# Patient Record
Sex: Female | Born: 1975 | Race: Black or African American | Hispanic: No | Marital: Married | Smoking: Never smoker
Health system: Southern US, Community
[De-identification: ages and names within clinical notes are randomized; demographics above are authoritative.]

## PROBLEM LIST (undated history)

## (undated) DIAGNOSIS — E119 Type 2 diabetes mellitus without complications: Secondary | ICD-10-CM

---

## 2005-06-03 ENCOUNTER — Encounter: Admission: RE | Admit: 2005-06-03 | Discharge: 2005-06-03 | Payer: Self-pay | Admitting: Family Medicine

## 2006-01-27 ENCOUNTER — Encounter: Admission: RE | Admit: 2006-01-27 | Discharge: 2006-01-27 | Payer: Self-pay | Admitting: Family Medicine

## 2006-03-29 ENCOUNTER — Emergency Department (HOSPITAL_COMMUNITY): Admission: EM | Admit: 2006-03-29 | Discharge: 2006-03-29 | Payer: Self-pay | Admitting: Emergency Medicine

## 2006-11-27 ENCOUNTER — Ambulatory Visit: Payer: Self-pay | Admitting: Endocrinology

## 2007-06-23 ENCOUNTER — Encounter: Payer: Self-pay | Admitting: *Deleted

## 2007-06-23 DIAGNOSIS — R635 Abnormal weight gain: Secondary | ICD-10-CM | POA: Insufficient documentation

## 2007-06-23 DIAGNOSIS — E282 Polycystic ovarian syndrome: Secondary | ICD-10-CM | POA: Insufficient documentation

## 2007-06-23 DIAGNOSIS — M545 Low back pain, unspecified: Secondary | ICD-10-CM | POA: Insufficient documentation

## 2007-06-23 DIAGNOSIS — E119 Type 2 diabetes mellitus without complications: Secondary | ICD-10-CM | POA: Insufficient documentation

## 2007-08-16 ENCOUNTER — Encounter: Payer: Self-pay | Admitting: Endocrinology

## 2007-08-20 ENCOUNTER — Encounter: Payer: Self-pay | Admitting: Endocrinology

## 2007-08-21 ENCOUNTER — Ambulatory Visit: Payer: Self-pay | Admitting: Endocrinology

## 2007-08-21 ENCOUNTER — Telehealth (INDEPENDENT_AMBULATORY_CARE_PROVIDER_SITE_OTHER): Payer: Self-pay | Admitting: *Deleted

## 2007-10-02 ENCOUNTER — Telehealth: Payer: Self-pay | Admitting: Endocrinology

## 2007-10-02 ENCOUNTER — Ambulatory Visit: Payer: Self-pay | Admitting: Endocrinology

## 2007-10-02 DIAGNOSIS — I10 Essential (primary) hypertension: Secondary | ICD-10-CM | POA: Insufficient documentation

## 2007-10-02 DIAGNOSIS — K769 Liver disease, unspecified: Secondary | ICD-10-CM | POA: Insufficient documentation

## 2007-10-03 LAB — CONVERTED CEMR LAB
ALT: 27 units/L (ref 0–35)
AST: 25 units/L (ref 0–37)
Albumin: 3.5 g/dL (ref 3.5–5.2)
Alkaline Phosphatase: 61 units/L (ref 39–117)
BUN: 8 mg/dL (ref 6–23)
Bilirubin Urine: NEGATIVE
Bilirubin, Direct: 0.1 mg/dL (ref 0.0–0.3)
CO2: 26 meq/L (ref 19–32)
Calcium: 8.5 mg/dL (ref 8.4–10.5)
Chloride: 106 meq/L (ref 96–112)
Creatinine, Ser: 0.6 mg/dL (ref 0.4–1.2)
GFR calc Af Amer: 149 mL/min
GFR calc non Af Amer: 123 mL/min
Glucose, Bld: 97 mg/dL (ref 70–99)
Hemoglobin, Urine: NEGATIVE
Hgb A1c MFr Bld: 5.3 % (ref 4.6–6.0)
Ketones, ur: NEGATIVE mg/dL
Leukocytes, UA: NEGATIVE
Nitrite: NEGATIVE
Potassium: 3.9 meq/L (ref 3.5–5.1)
Sodium: 139 meq/L (ref 135–145)
Specific Gravity, Urine: 1.015 (ref 1.000–1.03)
TSH: 1.74 microintl units/mL (ref 0.35–5.50)
Total Bilirubin: 0.5 mg/dL (ref 0.3–1.2)
Total Protein, Urine: NEGATIVE mg/dL
Total Protein: 6.8 g/dL (ref 6.0–8.3)
Urine Glucose: NEGATIVE mg/dL
Urobilinogen, UA: 0.2 (ref 0.0–1.0)
pH: 6 (ref 5.0–8.0)

## 2008-01-10 ENCOUNTER — Emergency Department (HOSPITAL_COMMUNITY): Admission: EM | Admit: 2008-01-10 | Discharge: 2008-01-11 | Payer: Self-pay | Admitting: Emergency Medicine

## 2009-03-03 ENCOUNTER — Inpatient Hospital Stay (HOSPITAL_COMMUNITY): Admission: AD | Admit: 2009-03-03 | Discharge: 2009-03-03 | Payer: Self-pay | Admitting: Obstetrics & Gynecology

## 2010-02-19 ENCOUNTER — Ambulatory Visit (HOSPITAL_COMMUNITY): Admission: RE | Admit: 2010-02-19 | Discharge: 2010-02-19 | Payer: Self-pay | Admitting: Surgery

## 2010-03-04 ENCOUNTER — Ambulatory Visit (HOSPITAL_COMMUNITY): Admission: RE | Admit: 2010-03-04 | Discharge: 2010-03-04 | Payer: Self-pay | Admitting: Surgery

## 2010-03-18 ENCOUNTER — Encounter: Admission: RE | Admit: 2010-03-18 | Discharge: 2010-03-18 | Payer: Self-pay | Admitting: Surgery

## 2010-03-25 ENCOUNTER — Ambulatory Visit (HOSPITAL_BASED_OUTPATIENT_CLINIC_OR_DEPARTMENT_OTHER): Admission: RE | Admit: 2010-03-25 | Discharge: 2010-03-25 | Payer: Self-pay | Admitting: Surgery

## 2010-03-27 ENCOUNTER — Ambulatory Visit: Payer: Self-pay | Admitting: Internal Medicine

## 2010-10-02 ENCOUNTER — Ambulatory Visit (HOSPITAL_COMMUNITY): Admission: RE | Admit: 2010-10-02 | Payer: Self-pay | Admitting: Surgery

## 2010-10-05 ENCOUNTER — Ambulatory Visit: Payer: Self-pay | Admitting: Hematology and Oncology

## 2010-11-04 NOTE — Assessment & Plan Note (Signed)
Summary: DIABETIC-STC   Vital Signs:  Patient Profile:   35 Years Old Female Weight:      293.38 pounds Temp:     98.3 degrees F Pulse rate:   93 / minute BP sitting:   135 / 97  (left arm) Cuff size:   large  Vitals Entered By: Orlan Leavens (August 21, 2007 8:15 AM)                 Visit Type:  f/u  Chief Complaint:  dm.  History of Present Illness: she is not currently taking her metformin.  She is also currently pursuing bariatric surgery.  She had a 75g oral glucose tolerance test which showed a fasting glucose of 134.  It then increased to 287 at one hour, and 256 at two hours.  Since she was last here about 9 months ago, she gained 4 pounds.  This is despite her efforts. She also continues to have manner Fredrik Cove, for which she is scheduled to have D and c surgery in two days. Symptomatically, she has some heartburn, headache, and a few myalgias.    Current Allergies: No known allergies   Past Medical History:    Reviewed history from 06/23/2007 and no changes required:       Diabetes mellitus, type II       Low back pain     Review of Systems  The patient denies chest pain and dyspnea on exhertion.     Physical Exam  General:     obese.   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 Take 1 by mouth two times a day qd  Her updated medication list for this problem includes:    Glucophage Xr 500 Mg Tb24 (Metformin hcl) .Marland KitchenMarland KitchenMarland KitchenMarland Kitchen 4 tablets daily   Problem # 2:  DIABETES MELLITUS, TYPE II  (ICD-250.00)  The following medications were removed from the medication list:    Glucophage Xr 500 Mg Tb24 (Metformin hcl) .Marland Kitchen... Take 1 by mouth two times a day qd  Her updated medication list for this problem includes:    Glucophage Xr 500 Mg Tb24 (Metformin hcl) .Marland KitchenMarland KitchenMarland KitchenMarland Kitchen 4 tablets daily  Orders: Est. Patient Level IV (16109) EKG w/ Interpretation (93000)   Problem # 3:  gerd  Medications Added to Medication List This Visit: 1)  Glucophage Xr 500 Mg Tb24 (Metformin hcl) .... 4 tablets daily   Patient Instructions: 1)  metformin extended release 2000 mg a day 2)  return 6 weeks 3)  in my opinion, she is medically cleared for surgery.  Her risk is low and acceptable 4)  medication for her GERD is declined    Prescriptions: GLUCOPHAGE XR 500 MG  TB24 (METFORMIN HCL) 4 tablets daily  #120 x 11   Entered and Authorized by:   Minus Breeding MD   Signed by:   Minus Breeding MD on 08/21/2007   Method used:   Electronically sent to .Marland KitchenMarland Kitchen  [facility] [address] # [phone]*       [address]       [place], [place]  [address]       Ph: [phone]       Fax: [phone]   RxID:   [id number]  ]  Appended Document: DIABETIC-STC FAXED NOTES TO DR. FOGELMAN @[phone]/LMB

## 2010-11-04 NOTE — Progress Notes (Signed)
  Phone Note Call from Patient Call back at Home Phone [phone] Call back at [phone]   Caller: Patient Call For: dr Everardo All Summary of Call: per pt saw dr Everardo All on yesterday was dx  with diabetes on yesterday and  was prescribe metforim  today . drop  down to  low sugars 66 twice today . where is she suppose to be reguarding level wise . should her medication  be adjusted.Patient's chart has been requested.  Initial call taken by: Shelbie Proctor,  August 21, 2007 3:32 PM  Follow-up for Phone Call        please continue to take metformin--66 is a good blood sugar Follow-up by: Minus Breeding MD,  August 21, 2007 8:10 PM  Additional Follow-up for Phone Call Additional follow up Details #1::        Phone Call Completed pt made aware spoke with pt made aware Additional Follow-up by: Shelbie Proctor,  August 22, 2007 8:21 AM

## 2010-11-04 NOTE — Progress Notes (Signed)
Summary: refill  Phone Note Call from Patient Call back at Home Phone [phone]   Caller: Patient Call For: dr Everardo All  Summary of Call: need an rx for soma [facility] [phone] Initial call taken by: Shelbie Proctor,  October 02, 2007 4:50 PM  Follow-up for Phone Call        sent Follow-up by: Minus Breeding MD,  October 02, 2007 5:01 PM    New/Updated Medications: SOMA 250 MG  TABS (CARISOPRODOL) take 1 by mouth once daily as needed pain or cramps   Prescriptions: SOMA 250 MG  TABS (CARISOPRODOL) take 1 by mouth once daily as needed pain or cramps  #50 x 6   Entered and Authorized by:   Minus Breeding MD   Signed by:   Minus Breeding MD on 10/02/2007   Method used:   Electronically sent to ...       [facility] # [phone]*       [address]       [place], [place]  [address]       Ph: [phone]       Fax: [phone]   RxID:   [id number]

## 2010-11-04 NOTE — Letter (Signed)
Summary: [facility]  [facility]   Imported By: Esmeralda Links D'jimraou 08/27/2007 14:46:43  _____________________________________________________________________  External Attachment:    Type:   Image     Comment:   External Document

## 2010-11-04 NOTE — Assessment & Plan Note (Signed)
Summary: 6 WK FU-STC   Vital Signs:  Patient Profile:   35 Years Old Female Weight:      291.2 pounds Temp:     97.9 degrees F oral Pulse rate:   88 / minute BP sitting:   143 / 93  (right arm) Cuff size:   large  Vitals Entered By: Orlan Leavens (October 02, 2007 8:00 AM)                 Visit Type:  Follow-up Visit  Chief Complaint:  dm.  History of Present Illness: patient states she is taking her metformin, despite some GI upset.  She gets from the hypertension is again noted today was noted on life insurance form to have elev lft 1 month of right arm pain.  no assoc numbness.  affects the forearm and the wrist+hand.  no local injury.   Current Allergies: No known allergies   Past Medical History:    Reviewed history from 06/23/2007 and no changes required:       UNSPECIFIED DISORDER OF LIVER (ICD-573.9)       HYPERTENSION (ICD-401.9)       POLYCYSTIC OVARIAN DISEASE (ICD-256.4)       WEIGHT GAIN (ICD-783.1)       LOW BACK PAIN (ICD-724.2)       DIABETES MELLITUS, TYPE II (ICD-250.00)            Review of Systems  The patient denies chest pain and dyspnea on exhertion.     Physical Exam  General:     obese.   Extremities:     no edema    Impression & Recommendations:  Problem # 1:  WEIGHT GAIN (ICD-783.1)  Problem # 2:  DIABETES MELLITUS, TYPE II (ICD-250.00)  Her updated medication list for this problem includes:    Glucophage Xr 500 Mg Tb24 (Metformin hcl) .Marland KitchenMarland KitchenMarland KitchenMarland Kitchen 4 tablets daily  Orders: TLB-TSH (Thyroid Stimulating Hormone) (84443-TSH) TLB-BMP (Basic Metabolic Panel-BMET) (80048-METABOL) TLB-A1C / Hgb A1C (Glycohemoglobin) (83036-A1C) TLB-Udip ONLY (81003-UDIP) Est. Patient Level IV (95621)   Problem # 3:  HYPERTENSION (ICD-401.9)  Problem # 4:  UNSPECIFIED DISORDER OF LIVER (ICD-573.9)  Orders: TLB-Hepatic/Liver Function Pnl (80076-HEPATIC)    Patient Instructions: 1)  check a1c, ua, tsh, lft,  and bmet 2)  bp rx is  refused--pt advised of risk 3)  ret 6 mos 4)  redouble diet efforts    ]

## 2011-01-10 LAB — CBC
HCT: 32.3 % — ABNORMAL LOW (ref 36.0–46.0)
Hemoglobin: 11 g/dL — ABNORMAL LOW (ref 12.0–15.0)
MCHC: 34 g/dL (ref 30.0–36.0)
MCV: 89.6 fL (ref 78.0–100.0)
Platelets: 289 10*3/uL (ref 150–400)
RBC: 3.6 MIL/uL — ABNORMAL LOW (ref 3.87–5.11)
RDW: 15.9 % — ABNORMAL HIGH (ref 11.5–15.5)
WBC: 10.9 10*3/uL — ABNORMAL HIGH (ref 4.0–10.5)

## 2011-01-10 LAB — GLUCOSE, CAPILLARY: Glucose-Capillary: 90 mg/dL (ref 70–99)

## 2011-02-18 NOTE — Consult Note (Signed)
[facility]                          ENDOCRINOLOGY CONSULTATION   Jade Sims, Jade Sims                    MRN:          [id number]  DATE:11/27/2006                            DOB:          [birth year]    REASON FOR REFERRAL:  At risk for diabetes.   HISTORY OF PRESENT ILLNESS:  A 35 year old woman who had gestational  diabetes with her 2002 pregnancy.  She describes her diet as good but  her exercise is poor.  Specifically, she states her exercise has been  limited by back pain.   Regarding her polycystic ovary syndrome, she is back on Yasmin because  she had to go off her metformin due to diarrhea.  She has gained 10  pounds in the past 6 months despite her efforts.   PAST MEDICAL HISTORY:  Chronic low back pain.   MEDICATIONS:  Only as noted above.   SOCIAL HISTORY:  She is married.  She works as a Customer service manager.   FAMILY HISTORY:  Positive for her mother who she states is thin.   REVIEW OF SYSTEMS:  Denies the following:  Chest pain, shortness of  breath and rectal bleeding.   PHYSICAL EXAMINATION:  Blood pressure 138/83, heart rate 94, temperature  is 98.4.  The weight is 289.  GENERAL:  Obese, no distress.  SKIN:  Normal texture and temperature.  Not diaphoretic.  HEENT:  No proptosis.  No periorbital swelling.  NECK:  No goiter.  CHEST:  Clear to auscultation.  No respiratory distress.  CARDIOVASCULAR:  No JVD, no edema.  Regular rate and rhythm.  No murmur.  Pedal pulses are intact.  EXTREMITIES:  Feet are normal color and temperature.  There is no ulcer  present on the feet.  NEUROLOGIC:  Sensation is intact to touch in the feet.   IMPRESSION:  1. History of gestational diabetes.  2. Diarrhea due to her metformin.  3. Chronic weight gain.  4. Polycystic ovary syndrome.  5. History of gestational diabetes.   PLAN:  1. I gave her a prescription to try extended release metformin      instead, 500 mg a day.  2. Return 3  months.  3. I agree with the continuation of the Yasmin.  4. Referred to a bariatric surgeon.  Referral is being made today.     Sean A. Everardo All, MD  Electronically Signed    SAE/MedQ  DD: 11/28/2006  DT: 11/28/2006  Job #: [id number]   cc:   Maxie Better, M.D.

## 2011-06-28 LAB — COMPREHENSIVE METABOLIC PANEL
ALT: 23
AST: 21
Albumin: 3.8
Alkaline Phosphatase: 57
BUN: 10
CO2: 22
Calcium: 8.6
Chloride: 105
Creatinine, Ser: 0.65
GFR calc Af Amer: >60
GFR calc non Af Amer: >60
Glucose, Bld: 108 — ABNORMAL HIGH
Potassium: 3.9
Sodium: 135
Total Bilirubin: 0.6
Total Protein: 7.2

## 2011-06-28 LAB — DIFFERENTIAL
Basophils Absolute: 0
Basophils Relative: 0
Eosinophils Absolute: 0.1
Eosinophils Relative: 2
Lymphocytes Relative: 25
Lymphs Abs: 0.7
Monocytes Absolute: 0.3
Monocytes Relative: 12
Neutro Abs: 1.6 — ABNORMAL LOW
Neutrophils Relative %: 60

## 2011-06-28 LAB — CBC
HCT: 35.8 — ABNORMAL LOW
Hemoglobin: 12.5
MCHC: 34.7
MCV: 83.3
Platelets: 258
RBC: 4.3
RDW: 18.1 — ABNORMAL HIGH
WBC: 2.7 — ABNORMAL LOW

## 2011-06-28 LAB — URINALYSIS, ROUTINE W REFLEX MICROSCOPIC
Bilirubin Urine: NEGATIVE
Glucose, UA: NEGATIVE
Hgb urine dipstick: NEGATIVE
Nitrite: NEGATIVE
Protein, ur: NEGATIVE
Specific Gravity, Urine: 1.028
Urobilinogen, UA: 0.2
pH: 6

## 2011-06-28 LAB — MONONUCLEOSIS SCREEN: Mono Screen: NEGATIVE

## 2011-07-15 ENCOUNTER — Emergency Department (HOSPITAL_COMMUNITY)
Admission: EM | Admit: 2011-07-15 | Discharge: 2011-07-16 | Disposition: A | Discharge status: Home / Self Care | Payer: BC Managed Care – PPO | Attending: Emergency Medicine | Admitting: Emergency Medicine

## 2011-07-15 DIAGNOSIS — Z79899 Other long term (current) drug therapy: Secondary | ICD-10-CM | POA: Insufficient documentation

## 2011-07-15 DIAGNOSIS — R1013 Epigastric pain: Secondary | ICD-10-CM | POA: Insufficient documentation

## 2011-07-15 DIAGNOSIS — E559 Vitamin D deficiency, unspecified: Secondary | ICD-10-CM | POA: Insufficient documentation

## 2011-07-15 DIAGNOSIS — I880 Nonspecific mesenteric lymphadenitis: Secondary | ICD-10-CM | POA: Insufficient documentation

## 2011-07-15 DIAGNOSIS — E119 Type 2 diabetes mellitus without complications: Secondary | ICD-10-CM | POA: Insufficient documentation

## 2011-07-15 DIAGNOSIS — E282 Polycystic ovarian syndrome: Secondary | ICD-10-CM | POA: Insufficient documentation

## 2011-07-16 ENCOUNTER — Emergency Department (HOSPITAL_COMMUNITY): Payer: BC Managed Care – PPO

## 2011-07-16 LAB — URINE MICROSCOPIC-ADD ON

## 2011-07-16 LAB — DIFFERENTIAL
Basophils Absolute: 0 10*3/uL (ref 0.0–0.1)
Basophils Relative: 0 % (ref 0–1)
Eosinophils Absolute: 0.2 10*3/uL (ref 0.0–0.7)
Eosinophils Relative: 2 % (ref 0–5)
Lymphocytes Relative: 31 % (ref 12–46)
Lymphs Abs: 2.7 10*3/uL (ref 0.7–4.0)
Monocytes Absolute: 0.6 10*3/uL (ref 0.1–1.0)
Monocytes Relative: 6 % (ref 3–12)
Neutro Abs: 5.2 10*3/uL (ref 1.7–7.7)
Neutrophils Relative %: 60 % (ref 43–77)

## 2011-07-16 LAB — CBC
HCT: 39.4 % (ref 36.0–46.0)
Hemoglobin: 12.7 g/dL (ref 12.0–15.0)
MCH: 29.9 pg (ref 26.0–34.0)
MCHC: 32.2 g/dL (ref 30.0–36.0)
MCV: 92.7 fL (ref 78.0–100.0)
Platelets: 314 10*3/uL (ref 150–400)
RBC: 4.25 MIL/uL (ref 3.87–5.11)
RDW: 14.8 % (ref 11.5–15.5)
WBC: 8.7 10*3/uL (ref 4.0–10.5)

## 2011-07-16 LAB — PREGNANCY, URINE: Preg Test, Ur: NEGATIVE

## 2011-07-16 LAB — URINALYSIS, ROUTINE W REFLEX MICROSCOPIC
Glucose, UA: NEGATIVE mg/dL
Ketones, ur: 15 mg/dL — AB
Nitrite: NEGATIVE
Protein, ur: NEGATIVE mg/dL
Specific Gravity, Urine: 1.03 (ref 1.005–1.030)
Urobilinogen, UA: 1 mg/dL (ref 0.0–1.0)
pH: 5.5 (ref 5.0–8.0)

## 2011-07-16 LAB — COMPREHENSIVE METABOLIC PANEL
ALT: 119 U/L — ABNORMAL HIGH (ref 0–35)
AST: 54 U/L — ABNORMAL HIGH (ref 0–37)
Albumin: 3.8 g/dL (ref 3.5–5.2)
Alkaline Phosphatase: 62 U/L (ref 39–117)
BUN: 9 mg/dL (ref 6–23)
CO2: 23 mEq/L (ref 19–32)
Calcium: 9.2 mg/dL (ref 8.4–10.5)
Chloride: 100 mEq/L (ref 96–112)
Creatinine, Ser: 0.67 mg/dL (ref 0.50–1.10)
GFR calc Af Amer: >90 mL/min (ref >90)
GFR calc non Af Amer: >90 mL/min (ref >90)
Glucose, Bld: 93 mg/dL (ref 70–99)
Potassium: 3.4 mEq/L — ABNORMAL LOW (ref 3.5–5.1)
Sodium: 135 mEq/L (ref 135–145)
Total Bilirubin: 0.3 mg/dL (ref 0.3–1.2)
Total Protein: 7.8 g/dL (ref 6.0–8.3)

## 2011-07-16 LAB — LIPASE, BLOOD: Lipase: 27 U/L (ref 11–59)

## 2011-07-17 LAB — URINE CULTURE
Colony Count: 15000
Culture  Setup Time: 201210131122

## 2011-07-26 ENCOUNTER — Other Ambulatory Visit: Payer: Self-pay | Admitting: Gastroenterology

## 2011-07-26 DIAGNOSIS — R7989 Other specified abnormal findings of blood chemistry: Secondary | ICD-10-CM

## 2011-07-28 ENCOUNTER — Other Ambulatory Visit: Payer: BC Managed Care – PPO

## 2011-08-02 ENCOUNTER — Ambulatory Visit
Admission: RE | Admit: 2011-08-02 | Discharge: 2011-08-02 | Disposition: A | Discharge status: Home / Self Care | Payer: BC Managed Care – PPO | Source: Ambulatory Visit | Attending: Gastroenterology | Admitting: Gastroenterology

## 2011-08-02 ENCOUNTER — Other Ambulatory Visit: Payer: BC Managed Care – PPO

## 2011-08-02 DIAGNOSIS — R7989 Other specified abnormal findings of blood chemistry: Secondary | ICD-10-CM

## 2011-08-03 ENCOUNTER — Other Ambulatory Visit: Payer: Self-pay | Admitting: Gastroenterology

## 2011-08-03 DIAGNOSIS — R110 Nausea: Secondary | ICD-10-CM

## 2011-08-04 ENCOUNTER — Ambulatory Visit
Admission: RE | Admit: 2011-08-04 | Discharge: 2011-08-04 | Disposition: A | Discharge status: Home / Self Care | Payer: BC Managed Care – PPO | Source: Ambulatory Visit | Attending: Gastroenterology | Admitting: Gastroenterology

## 2011-08-04 DIAGNOSIS — R110 Nausea: Secondary | ICD-10-CM

## 2011-08-04 MED ORDER — IOHEXOL 300 MG/ML  SOLN: 125.0000 mL | Freq: Once | INTRAMUSCULAR | Status: AC | PRN

## 2011-08-23 ENCOUNTER — Other Ambulatory Visit: Payer: Self-pay | Admitting: Gastroenterology

## 2011-08-23 DIAGNOSIS — R110 Nausea: Secondary | ICD-10-CM

## 2011-08-23 DIAGNOSIS — R1011 Right upper quadrant pain: Secondary | ICD-10-CM

## 2011-09-09 ENCOUNTER — Ambulatory Visit: Payer: BC Managed Care – PPO

## 2011-09-09 NOTE — Progress Notes (Signed)
Pt failed to keep appt. For genetic counseling

## 2011-09-19 ENCOUNTER — Other Ambulatory Visit (HOSPITAL_COMMUNITY): Payer: BC Managed Care – PPO

## 2011-09-21 ENCOUNTER — Other Ambulatory Visit (HOSPITAL_COMMUNITY): Payer: BC Managed Care – PPO

## 2011-10-18 ENCOUNTER — Inpatient Hospital Stay (HOSPITAL_COMMUNITY): Admission: RE | Admit: 2011-10-18 | Payer: BC Managed Care – PPO | Source: Ambulatory Visit

## 2011-10-31 ENCOUNTER — Encounter (HOSPITAL_COMMUNITY)
Admission: RE | Admit: 2011-10-31 | Discharge: 2011-10-31 | Disposition: A | Discharge status: Home / Self Care | Payer: BC Managed Care – PPO | Source: Ambulatory Visit | Attending: Gastroenterology | Admitting: Gastroenterology

## 2011-10-31 DIAGNOSIS — R110 Nausea: Secondary | ICD-10-CM | POA: Insufficient documentation

## 2011-10-31 DIAGNOSIS — R1011 Right upper quadrant pain: Secondary | ICD-10-CM | POA: Insufficient documentation

## 2011-10-31 MED ORDER — TECHNETIUM TC 99M SULFUR COLLOID
2.0000 | Freq: Once | INTRAVENOUS | Status: AC | PRN
  Administered 2011-10-31: 2 via INTRAVENOUS

## 2011-11-22 ENCOUNTER — Encounter (HOSPITAL_COMMUNITY)
Admission: RE | Admit: 2011-11-22 | Discharge: 2011-11-22 | Disposition: A | Discharge status: Home / Self Care | Payer: BC Managed Care – PPO | Source: Ambulatory Visit | Attending: Gastroenterology | Admitting: Gastroenterology

## 2011-11-22 DIAGNOSIS — R1011 Right upper quadrant pain: Secondary | ICD-10-CM

## 2011-11-22 DIAGNOSIS — R110 Nausea: Secondary | ICD-10-CM

## 2014-02-20 ENCOUNTER — Emergency Department (HOSPITAL_COMMUNITY): Payer: BC Managed Care – PPO

## 2014-02-20 ENCOUNTER — Encounter (HOSPITAL_COMMUNITY): Payer: Self-pay | Admitting: Emergency Medicine

## 2014-02-20 ENCOUNTER — Emergency Department (HOSPITAL_COMMUNITY)
Admission: EM | Admit: 2014-02-20 | Discharge: 2014-02-20 | Disposition: A | Discharge status: Home / Self Care | Payer: BC Managed Care – PPO | Attending: Emergency Medicine | Admitting: Emergency Medicine

## 2014-02-20 DIAGNOSIS — E119 Type 2 diabetes mellitus without complications: Secondary | ICD-10-CM | POA: Insufficient documentation

## 2014-02-20 DIAGNOSIS — Z3202 Encounter for pregnancy test, result negative: Secondary | ICD-10-CM | POA: Insufficient documentation

## 2014-02-20 DIAGNOSIS — R202 Paresthesia of skin: Secondary | ICD-10-CM

## 2014-02-20 DIAGNOSIS — R209 Unspecified disturbances of skin sensation: Secondary | ICD-10-CM | POA: Insufficient documentation

## 2014-02-20 HISTORY — DX: Type 2 diabetes mellitus without complications: E11.9

## 2014-02-20 LAB — PROTIME-INR
INR: 0.94 (ref 0.00–1.49)
Prothrombin Time: 12.4 seconds (ref 11.6–15.2)

## 2014-02-20 LAB — CBC
HCT: 37.9 % (ref 36.0–46.0)
Hemoglobin: 12.5 g/dL (ref 12.0–15.0)
MCH: 30.2 pg (ref 26.0–34.0)
MCHC: 33 g/dL (ref 30.0–36.0)
MCV: 91.5 fL (ref 78.0–100.0)
Platelets: 348 10*3/uL (ref 150–400)
RBC: 4.14 MIL/uL (ref 3.87–5.11)
RDW: 14.9 % (ref 11.5–15.5)
WBC: 10.2 10*3/uL (ref 4.0–10.5)

## 2014-02-20 LAB — COMPREHENSIVE METABOLIC PANEL
ALT: 15 U/L (ref 0–35)
AST: 16 U/L (ref 0–37)
Albumin: 3.7 g/dL (ref 3.5–5.2)
Alkaline Phosphatase: 72 U/L (ref 39–117)
BUN: 15 mg/dL (ref 6–23)
CO2: 23 mEq/L (ref 19–32)
Calcium: 9.2 mg/dL (ref 8.4–10.5)
Chloride: 105 mEq/L (ref 96–112)
Creatinine, Ser: 0.69 mg/dL (ref 0.50–1.10)
GFR calc Af Amer: >90 mL/min (ref >90)
GFR calc non Af Amer: >90 mL/min (ref >90)
Glucose, Bld: 89 mg/dL (ref 70–99)
Potassium: 4.3 mEq/L (ref 3.7–5.3)
Sodium: 140 mEq/L (ref 137–147)
Total Bilirubin: <0.2 mg/dL — ABNORMAL LOW (ref 0.3–1.2)
Total Protein: 7.4 g/dL (ref 6.0–8.3)

## 2014-02-20 LAB — DIFFERENTIAL
Basophils Absolute: 0 10*3/uL (ref 0.0–0.1)
Basophils Relative: 0 % (ref 0–1)
Eosinophils Absolute: 0.2 10*3/uL (ref 0.0–0.7)
Eosinophils Relative: 2 % (ref 0–5)
Lymphocytes Relative: 31 % (ref 12–46)
Lymphs Abs: 3.1 10*3/uL (ref 0.7–4.0)
Monocytes Absolute: 0.5 10*3/uL (ref 0.1–1.0)
Monocytes Relative: 5 % (ref 3–12)
Neutro Abs: 6.4 10*3/uL (ref 1.7–7.7)
Neutrophils Relative %: 62 % (ref 43–77)

## 2014-02-20 LAB — CBG MONITORING, ED: Glucose-Capillary: 86 mg/dL (ref 70–99)

## 2014-02-20 LAB — URINALYSIS, ROUTINE W REFLEX MICROSCOPIC
Bilirubin Urine: NEGATIVE
Glucose, UA: NEGATIVE mg/dL
Ketones, ur: NEGATIVE mg/dL
Nitrite: NEGATIVE
Protein, ur: NEGATIVE mg/dL
Specific Gravity, Urine: 1.025 (ref 1.005–1.030)
Urobilinogen, UA: 0.2 mg/dL (ref 0.0–1.0)
pH: 6 (ref 5.0–8.0)

## 2014-02-20 LAB — APTT: aPTT: 32 seconds (ref 24–37)

## 2014-02-20 LAB — URINE MICROSCOPIC-ADD ON

## 2014-02-20 LAB — I-STAT TROPONIN, ED: Troponin i, poc: 0.07 ng/mL (ref 0.00–0.08)

## 2014-02-20 LAB — POC URINE PREG, ED: Preg Test, Ur: NEGATIVE

## 2014-02-20 MED ORDER — VALACYCLOVIR HCL 1 G PO TABS: 1000.0000 mg | ORAL_TABLET | Freq: Three times a day (TID) | ORAL | Status: AC

## 2014-02-20 MED ORDER — VALACYCLOVIR HCL 500 MG PO TABS
1000.0000 mg | ORAL_TABLET | Freq: Once | ORAL | Status: AC
  Administered 2014-02-20: 1000 mg via ORAL
  Filled 2014-02-20: qty 2

## 2014-02-20 NOTE — ED Notes (Signed)
Pt reports yesterday felt numbness to inside of left mouth, also tingling to left side of head. Pt sent here from PCP, sent here, hasn't been taking DM medications for 1 week. Pt is neurologically intact. Speech is clear and concise. No facial droop noted, reports still reports left side of mouth is heavy in the corner.

## 2014-02-20 NOTE — Discharge Instructions (Signed)
Please read and follow all provided instructions.  Your diagnoses today include:  1. Paresthesias     Tests performed today include:  Blood counts and electrolytes - normal  MRI - does not show a stroke or any other serious cause of your facial sensations  Vital signs. See below for your results today.   Medications prescribed:  Valtrex - medication to help with possible Bell's palsy  Take any prescribed medications only as directed.  Home care instructions:  Follow any educational materials contained in this packet.  BE VERY CAREFUL not to take multiple medicines containing Tylenol (also called acetaminophen). Doing so can lead to an overdose which can damage your liver and cause liver failure and possibly death.   Follow-up instructions: Please follow-up with your primary care provider in the next 3 days for further evaluation of your symptoms. If you do not have a primary care doctor -- see below for referral information.   Return instructions:   Please return to the Emergency Department if you experience worsening symptoms.   Please return if you have any other emergent concerns.  Additional Information:  Your vital signs today were: BP 155/78   Pulse 84   Temp(Src) 98.8 F (37.1 C) (Oral)   Resp 18   SpO2 96%   LMP 02/19/2014 If your blood pressure (BP) was elevated above 135/85 this visit, please have this repeated by your doctor within one month. --------------

## 2014-02-20 NOTE — ED Provider Notes (Signed)
CSN: [id number]     Arrival date & time 02/20/14  1724 History   First MD Initiated Contact with Patient 02/20/14 1916     Chief Complaint  Patient presents with  . Numbness     (Consider location/radiation/quality/duration/timing/severity/associated sxs/prior Treatment) HPI Comments: Patient presents with complaint of numbness/tinging to her left face and tingling to her left scalp that began yesterday. Patient saw primary care physician today who sent her to the emergency department for evaluation. Patient states that the sensation feels like "Novocain wearing off". She still has sensation however sensation feels decreased. No change in taste. No facial droop. No head injury. No fever, N/V, neck pain. Patient has diabetes. No hypertension or hypercholesterolemia. She does not smoke. She has a family history of stroke. Patient denies signs of stroke including: facial droop, slurred speech, aphasia, weakness/numbness in extremities, imbalance/trouble walking. The onset of this condition was acute. The course is constant. Aggravating factors: none. Alleviating factors: none.     The history is provided by the patient.    Past Medical History  Diagnosis Date  . Diabetes mellitus without complication    History reviewed. No pertinent past surgical history. No family history on file. History  Substance Use Topics  . Smoking status: Never Smoker   . Smokeless tobacco: Not on file  . Alcohol Use: No   OB History   Grav Para Term Preterm Abortions TAB SAB Ect Mult Living                 Review of Systems  Constitutional: Negative for fever.  HENT: Negative for congestion, dental problem, facial swelling, hearing loss, rhinorrhea, sinus pressure and trouble swallowing.   Eyes: Negative for photophobia, discharge, redness and visual disturbance.  Respiratory: Negative for shortness of breath.   Cardiovascular: Negative for chest pain.  Gastrointestinal: Negative for nausea and  vomiting.  Musculoskeletal: Negative for gait problem, neck pain and neck stiffness.  Skin: Negative for rash.  Neurological: Positive for numbness. Negative for syncope, speech difficulty, weakness, light-headedness and headaches.  Psychiatric/Behavioral: Negative for confusion.    Allergies  Review of patient's allergies indicates no known allergies.  Home Medications   Prior to Admission medications   Not on File   BP 155/92  Pulse 93  Temp(Src) 98.8 F (37.1 C) (Oral)  Resp 20  SpO2 97%  LMP 02/19/2014  Physical Exam  Nursing note and vitals reviewed. Constitutional: She is oriented to person, place, and time. She appears well-developed and well-nourished.  HENT:  Head: Normocephalic and atraumatic.  Right Ear: Tympanic membrane, external ear and ear canal normal.  Left Ear: Tympanic membrane, external ear and ear canal normal.  Nose: Nose normal.  Mouth/Throat: Uvula is midline, oropharynx is clear and moist and mucous membranes are normal.  Eyes: Conjunctivae, EOM and lids are normal. Pupils are equal, round, and reactive to light. Right eye exhibits no nystagmus. Left eye exhibits no nystagmus.  Neck: Normal range of motion. Neck supple.  Cardiovascular: Normal rate and regular rhythm.   Pulmonary/Chest: Effort normal and breath sounds normal.  Abdominal: Soft. There is no tenderness.  Musculoskeletal:       Cervical back: She exhibits normal range of motion, no tenderness and no bony tenderness.  Neurological: She is alert and oriented to person, place, and time. She has normal strength and normal reflexes. A sensory deficit is present. No cranial nerve deficit. She displays a negative Romberg sign. Coordination and gait normal. GCS eye subscore is 4. GCS verbal  subscore is 5. GCS motor subscore is 6.  Pt reports decreased sensation L chin, lips, cheek, forehead, ear.   Skin: Skin is warm and dry.  Psychiatric: She has a normal mood and affect.    ED Course   Procedures (including critical care time) Labs Review Labs Reviewed  COMPREHENSIVE METABOLIC PANEL - Abnormal; Notable for the following:    Total Bilirubin <0.2 (*)    All other components within normal limits  URINALYSIS, ROUTINE W REFLEX MICROSCOPIC - Abnormal; Notable for the following:    APPearance HAZY (*)    Hgb urine dipstick LARGE (*)    Leukocytes, UA TRACE (*)    All other components within normal limits  URINE MICROSCOPIC-ADD ON - Abnormal; Notable for the following:    Squamous Epithelial / LPF FEW (*)    All other components within normal limits  PROTIME-INR  APTT  CBC  DIFFERENTIAL  CBG MONITORING, ED  Rosezena SensorI-STAT TROPOININ, ED  POC URINE PREG, ED    Imaging Review Mr Brain Wo Contrast  02/20/2014   CLINICAL DATA:  Yesterday felt numbness in the left side of mouth and tingling left side of head. Off diabetic medication for the past week.  EXAM: MRI HEAD WITHOUT CONTRAST  TECHNIQUE: Multiplanar, multiecho pulse sequences of the brain and surrounding structures were obtained without intravenous contrast.  COMPARISON:  None.  FINDINGS: No acute infarct.  No intracranial hemorrhage.  No hydrocephalus.  Cerebellar tonsils minimally low lying but within the range of normal limits.  Partially empty sella without expansion or other findings to suggest pseudotumor cerebri.  Minimal exophthalmos.  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 This may be an incidental finding. If the patient develops diplopia with upward gaze, followup imaging may be considered to help exclude growth of the pineal gland.  Otherwise no intracranial mass lesion noted on this unenhanced exam  Decreased signal intensity of bone marrow may be related to patient's habitus. Correlation with CBC to exclude anemia contributing to this appearance may be considered  Major intracranial vascular structures are patent.  Top-normal size lymph nodes upper neck  IMPRESSION: No acute infarct.  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   Electronically Signed   By: Bridgett LarssonSteve  Olson M.D.   On: 02/20/2014 21:05     EKG Interpretation None      7:29 PM Patient seen and examined. Work-up initiated. D/w Dr. Manus Gunningancour. MR ordered as this is not clearly Bell's Palsy. Neuro deficits are mild.   Vital signs reviewed and are as follows: Filed Vitals:   02/20/14 1727  BP: 155/92  Pulse: 93  Temp: 98.8 F (37.1 C)  Resp: 20   10:05 PM Pt informed of results. Patient d/w Dr. Thad Rangereynolds by Dr. Manus Gunningancour. Will start Valtrex in case this is early Bell's. Will avoid steroids as pt is diabetic. Pt agrees with plan.   She is to followup with her primary care physician.  Patient urged to return with worsening symptoms or other concerns. Patient verbalized understanding and agrees with plan.     MDM   Final diagnoses:  Paresthesias   Patient with facial paresthesias, no objective motor findings on exam. MRI performed and does not show stroke or other concerning etiology. Patient is safe for discharge home. Will treat as above.  No dangerous or life-threatening conditions suspected or identified by history, physical exam, and by work-up. No indications for hospitalization identified.      Renne CriglerJoshua Mavis Fichera, PA-C 02/20/14 2207

## 2014-02-21 NOTE — ED Provider Notes (Signed)
Medical screening examination/treatment/procedure(s) were conducted as a shared visit with non-physician practitioner(s) and myself.  I personally evaluated the patient during the encounter.  L facial paresthesias x 1 day, otherwise neuro intact. No motor weakness, no facial droop. No stroke on MRI.  D/w Dr. Thad Ranger who states Bell's palsy still may be possible as motor weakness sometimes lags behind sensory changes. She recommends treating with antivirals.  No steroids given diabetes history.   EKG Interpretation None       Glynn Octave, MD 02/21/14 1052

## 2014-04-14 ENCOUNTER — Ambulatory Visit (HOSPITAL_BASED_OUTPATIENT_CLINIC_OR_DEPARTMENT_OTHER): Admit: 2014-04-14 | Payer: BC Managed Care – PPO | Admitting: Specialist

## 2014-04-14 ENCOUNTER — Encounter (HOSPITAL_BASED_OUTPATIENT_CLINIC_OR_DEPARTMENT_OTHER): Payer: Self-pay

## 2014-04-14 SURGERY — MAMMOPLASTY, REDUCTION
Anesthesia: General | Site: Breast | Laterality: Bilateral

## 2014-07-14 ENCOUNTER — Ambulatory Visit (HOSPITAL_BASED_OUTPATIENT_CLINIC_OR_DEPARTMENT_OTHER): Admit: 2014-07-14 | Payer: BC Managed Care – PPO | Admitting: Specialist

## 2014-07-14 ENCOUNTER — Encounter (HOSPITAL_BASED_OUTPATIENT_CLINIC_OR_DEPARTMENT_OTHER): Payer: Self-pay

## 2014-07-14 SURGERY — MAMMOPLASTY, REDUCTION
Anesthesia: General | Laterality: Bilateral

## 2016-01-19 ENCOUNTER — Emergency Department (HOSPITAL_COMMUNITY)
Admission: EM | Admit: 2016-01-19 | Discharge: 2016-01-19 | Disposition: A | Discharge status: Home / Self Care | Payer: BC Managed Care – PPO | Attending: Emergency Medicine | Admitting: Emergency Medicine

## 2016-01-19 DIAGNOSIS — K219 Gastro-esophageal reflux disease without esophagitis: Secondary | ICD-10-CM | POA: Insufficient documentation

## 2016-01-19 DIAGNOSIS — E119 Type 2 diabetes mellitus without complications: Secondary | ICD-10-CM | POA: Diagnosis not present

## 2016-01-19 DIAGNOSIS — M79602 Pain in left arm: Secondary | ICD-10-CM | POA: Insufficient documentation

## 2016-01-19 NOTE — ED Notes (Signed)
Patient states that she has waited over an hour for a doctor. Patient states she feels better and wants to leave. Patient left and went home.

## 2016-01-19 NOTE — ED Notes (Signed)
Pt states that she woke up 2 hours ago with indigestion and L sided arm pain. Alert and oriented.

## 2016-07-05 ENCOUNTER — Inpatient Hospital Stay (HOSPITAL_COMMUNITY): Admission: RE | Admit: 2016-07-05 | Payer: BC Managed Care – PPO | Source: Ambulatory Visit

## 2016-07-05 ENCOUNTER — Other Ambulatory Visit (HOSPITAL_COMMUNITY): Payer: Self-pay | Admitting: Internal Medicine

## 2016-07-05 DIAGNOSIS — M7989 Other specified soft tissue disorders: Secondary | ICD-10-CM

## 2016-07-27 ENCOUNTER — Inpatient Hospital Stay (HOSPITAL_COMMUNITY): Admission: RE | Admit: 2016-07-27 | Payer: BC Managed Care – PPO | Source: Ambulatory Visit

## 2016-08-05 ENCOUNTER — Encounter (HOSPITAL_COMMUNITY): Payer: Self-pay

## 2016-08-05 ENCOUNTER — Ambulatory Visit (HOSPITAL_COMMUNITY): Admit: 2016-08-05 | Payer: BC Managed Care – PPO | Admitting: Obstetrics

## 2016-08-05 SURGERY — ROBOTIC ASSISTED TOTAL HYSTERECTOMY WITH SALPINGECTOMY
Anesthesia: General | Laterality: Bilateral

## 2019-10-01 ENCOUNTER — Other Ambulatory Visit: Payer: Self-pay | Admitting: Internal Medicine

## 2019-10-01 DIAGNOSIS — R519 Headache, unspecified: Secondary | ICD-10-CM

## 2019-10-24 ENCOUNTER — Other Ambulatory Visit: Payer: Self-pay

## 2019-10-24 ENCOUNTER — Ambulatory Visit
Admission: RE | Admit: 2019-10-24 | Discharge: 2019-10-24 | Disposition: A | Discharge status: Home / Self Care | Payer: BC Managed Care – PPO | Source: Ambulatory Visit | Attending: Internal Medicine | Admitting: Internal Medicine

## 2019-10-24 DIAGNOSIS — R519 Headache, unspecified: Secondary | ICD-10-CM

## 2019-11-07 ENCOUNTER — Ambulatory Visit: Payer: Self-pay | Admitting: Neurology

## 2019-11-07 ENCOUNTER — Telehealth: Payer: Self-pay | Admitting: Neurology

## 2019-11-07 NOTE — Telephone Encounter (Signed)
Pt was a no show to new apt scheduled today

## 2019-11-08 ENCOUNTER — Encounter: Payer: Self-pay | Admitting: Neurology

## 2019-11-28 ENCOUNTER — Ambulatory Visit: Payer: BC Managed Care – PPO | Attending: Internal Medicine

## 2019-11-28 ENCOUNTER — Other Ambulatory Visit: Payer: Self-pay

## 2019-11-28 DIAGNOSIS — Z23 Encounter for immunization: Secondary | ICD-10-CM

## 2019-11-28 NOTE — Progress Notes (Signed)
   Covid-19 Vaccination Clinic  Name:  Jade Sims    MRN: [id number] DOB: 06-23-1976  11/28/2019  Jade Sims was observed post Covid-19 immunization for 15 minutes without incidence. She was provided with Vaccine Information Sheet and instruction to access the V-Safe system.   Jade Sims was instructed to call 911 with any severe reactions post vaccine: Marland Kitchen Difficulty breathing  . Swelling of your face and throat  . A fast heartbeat  . A bad rash all over your body  . Dizziness and weakness    Immunizations Administered    Name Date Dose VIS Date Route   Moderna COVID-19 Vaccine 11/28/2019 11:11 AM 0.5 mL 09/03/2019 Intramuscular   Manufacturer: Moderna   Lot: [id number]   NDC: 87579-728-20

## 2019-12-31 ENCOUNTER — Ambulatory Visit: Payer: BC Managed Care – PPO | Attending: Family

## 2019-12-31 DIAGNOSIS — Z23 Encounter for immunization: Secondary | ICD-10-CM

## 2019-12-31 NOTE — Progress Notes (Signed)
   Covid-19 Vaccination Clinic  Name:  Jade Sims    MRN: [id number] DOB: [birth year]  12/31/2019  Jade Sims was observed post Covid-19 immunization for 15 minutes without incident. She was provided with Vaccine Information Sheet and instruction to access the V-Safe system.   Jade Sims was instructed to call 911 with any severe reactions post vaccine: Marland Kitchen Difficulty breathing  . Swelling of face and throat  . A fast heartbeat  . A bad rash all over body  . Dizziness and weakness   Immunizations Administered    Name Date Dose VIS Date Route   Moderna COVID-19 Vaccine 12/31/2019 11:37 AM 0.5 mL 09/03/2019 Intramuscular   Manufacturer: Moderna   Lot: [id number]   NDC: 62376-283-15

## 2020-01-02 ENCOUNTER — Ambulatory Visit: Payer: BC Managed Care – PPO

## 2020-01-26 ENCOUNTER — Encounter (HOSPITAL_COMMUNITY): Payer: Self-pay

## 2020-01-26 ENCOUNTER — Other Ambulatory Visit: Payer: Self-pay

## 2020-01-26 ENCOUNTER — Emergency Department (HOSPITAL_COMMUNITY)
Admission: EM | Admit: 2020-01-26 | Discharge: 2020-01-26 | Disposition: A | Discharge status: Home / Self Care | Payer: BC Managed Care – PPO | Attending: Emergency Medicine | Admitting: Emergency Medicine

## 2020-01-26 DIAGNOSIS — Z5321 Procedure and treatment not carried out due to patient leaving prior to being seen by health care provider: Secondary | ICD-10-CM | POA: Diagnosis not present

## 2020-01-26 DIAGNOSIS — R200 Anesthesia of skin: Secondary | ICD-10-CM | POA: Diagnosis not present

## 2020-01-26 DIAGNOSIS — M549 Dorsalgia, unspecified: Secondary | ICD-10-CM | POA: Diagnosis not present

## 2020-01-26 DIAGNOSIS — R002 Palpitations: Secondary | ICD-10-CM | POA: Diagnosis not present

## 2020-01-26 DIAGNOSIS — M79602 Pain in left arm: Secondary | ICD-10-CM | POA: Diagnosis not present

## 2020-01-26 LAB — CBG MONITORING, ED: Glucose-Capillary: 195 mg/dL — ABNORMAL HIGH (ref 70–99)

## 2020-01-26 NOTE — ED Triage Notes (Signed)
Pt reports L arm pain hat started around 4p after working out. She states that the pain is better now. Also states that she felts her heart "flutter" at dinner. Denies chest pain at this time. A&Ox4. Ambulatory.

## 2020-01-26 NOTE — ED Notes (Signed)
Pt has decided to go home. Pt was advised to stay and told that if symptoms persist or worsen to return to the ED for further evaulation

## 2021-01-01 ENCOUNTER — Other Ambulatory Visit: Payer: Self-pay | Admitting: Gastroenterology

## 2021-02-05 ENCOUNTER — Ambulatory Visit (HOSPITAL_COMMUNITY)
Admission: RE | Admit: 2021-02-05 | Payer: BC Managed Care – PPO | Source: Home / Self Care | Admitting: Gastroenterology

## 2021-02-05 ENCOUNTER — Encounter (HOSPITAL_COMMUNITY): Admission: RE | Payer: Self-pay | Source: Home / Self Care

## 2021-02-05 SURGERY — COLONOSCOPY WITH PROPOFOL
Anesthesia: Monitor Anesthesia Care

## 2021-10-22 ENCOUNTER — Ambulatory Visit: Payer: BC Managed Care – PPO | Admitting: Family Medicine

## 2021-10-22 NOTE — Progress Notes (Deleted)
° °  I, Philbert Riser, LAT, ATC acting as a scribe for [name], MD.  Subjective:    CC: L leg pain   HPI: Pt is a 46 y/o female c/o L leg pain x /. Pt locates pain to  LBP: Radiates: LE numbness/tingling: LE weakness: Aggravates: Treatments tried:   Pertinent review of Systems: ***  Relevant historical information: ***   Objective:   There were no vitals filed for this visit. General: Well Developed, well nourished, and in no acute distress.   MSK: ***  Lab and Radiology Results No results found for this or any previous visit (from the past 72 hour(s)). No results found.    Impression and Recommendations:    Assessment and Plan: 46 y.o. female with ***.  PDMP not reviewed this encounter. No orders of the defined types were placed in this encounter.  No orders of the defined types were placed in this encounter.   Discussed warning signs or symptoms. Please see discharge instructions. Patient expresses understanding.   ***

## 2021-10-25 ENCOUNTER — Ambulatory Visit: Payer: BC Managed Care – PPO | Admitting: Family Medicine

## 2021-10-25 NOTE — Progress Notes (Deleted)
° ° °  Subjective:    CC: L leg pain and R finger numbness  I, Shloma Roggenkamp, LAT, ATC, am serving as scribe for Dr. Lynne Leader.  HPI: Pt is a 46 y/o female presenting w/ c/o L leg and R finger numbness.  L leg pain: L post lower leg pain x approximately one month w/ no known MOI -Swelling: -Aggravating factors: at night while trying to sleep;  -Treatments tried:  R finger numbness: -Hand/finger swelling: -Aggravating factors: -Treatments tried:  Pertinent review of Systems: ***  Relevant historical information: ***   Objective:   There were no vitals filed for this visit. General: Well Developed, well nourished, and in no acute distress.   MSK: ***  Lab and Radiology Results No results found for this or any previous visit (from the past 72 hour(s)). No results found.    Impression and Recommendations:    Assessment and Plan: 46 y.o. female with ***.  PDMP not reviewed this encounter. No orders of the defined types were placed in this encounter.  No orders of the defined types were placed in this encounter.   Discussed warning signs or symptoms. Please see discharge instructions. Patient expresses understanding.   ***

## 2021-10-27 ENCOUNTER — Ambulatory Visit: Payer: BC Managed Care – PPO | Admitting: Family Medicine

## 2021-10-27 NOTE — Progress Notes (Deleted)
° °  I, Philbert Riser, LAT, ATC acting as a scribe for [name], MD.  Subjective:    CC: leg and finger pain  HPI: Pt is a 46 y/o female c/o leg pain x /. Pt locates pain to  Low back pain: Radiates: LE numbness/tingling: LE weakness: Aggravates: Treatments tried:  Pt also c/o finger pain and numbness x /. Pt locates pain to  Grip strength: Numbness/tingling: Aggravates: Treatments tried:  Pertinent review of Systems: ***  Relevant historical information: ***   Objective:   There were no vitals filed for this visit. General: Well Developed, well nourished, and in no acute distress.   MSK: ***  Lab and Radiology Results No results found for this or any previous visit (from the past 72 hour(s)). No results found.    Impression and Recommendations:    Assessment and Plan: 46 y.o. female with ***.  PDMP not reviewed this encounter. No orders of the defined types were placed in this encounter.  No orders of the defined types were placed in this encounter.   Discussed warning signs or symptoms. Please see discharge instructions. Patient expresses understanding.   ***

## 2021-12-30 ENCOUNTER — Ambulatory Visit (INDEPENDENT_AMBULATORY_CARE_PROVIDER_SITE_OTHER): Payer: BC Managed Care – PPO | Admitting: Family Medicine

## 2021-12-30 ENCOUNTER — Ambulatory Visit: Payer: BC Managed Care – PPO | Admitting: Family Medicine

## 2021-12-30 ENCOUNTER — Ambulatory Visit (INDEPENDENT_AMBULATORY_CARE_PROVIDER_SITE_OTHER): Payer: BC Managed Care – PPO

## 2021-12-30 VITALS — BP 142/86 | Ht 66.0 in

## 2021-12-30 DIAGNOSIS — M5441 Lumbago with sciatica, right side: Secondary | ICD-10-CM

## 2021-12-30 DIAGNOSIS — R29898 Other symptoms and signs involving the musculoskeletal system: Secondary | ICD-10-CM

## 2021-12-30 MED ORDER — PREDNISONE 50 MG PO TABS: 50.0000 mg | ORAL_TABLET | Freq: Every day | ORAL | 0 refills | Status: DC

## 2021-12-30 MED ORDER — GABAPENTIN 300 MG PO CAPS: 300.0000 mg | ORAL_CAPSULE | Freq: Three times a day (TID) | ORAL | 3 refills | Status: DC | PRN

## 2021-12-30 MED ORDER — TRAMADOL HCL 50 MG PO TABS: 50.0000 mg | ORAL_TABLET | Freq: Three times a day (TID) | ORAL | 0 refills | Status: AC | PRN

## 2021-12-30 MED ORDER — LORAZEPAM 0.5 MG PO TABS: ORAL_TABLET | ORAL | 0 refills | Status: DC

## 2021-12-30 NOTE — Progress Notes (Deleted)
? ?  I, Philbert Riser, LAT, ATC acting as a scribe for [name], MD. ? ?Subjective:   ? ?CC: Radiating pain into her L leg ? ?HPI: Pt is a 46 y/o female c/o radiating pain into her L leg ongoing for about 1 month. Pt notes pain is better when standing and moving around. Of note, pt has been referred to rheumatology for possible fibromyalgia, but has not been able to reached to schedule. Pt locates pain to ? ?Radiating pain: yes ?LE numbness/tingling: ?LE weakness: ?Aggravates: ?Treatments tried: ? ?Dx imaging: 06/03/05 L-spine XR ? ?Pertinent review of Systems: *** ? ?Relevant historical information: *** ? ? ?Objective:   ?There were no vitals filed for this visit. ?General: Well Developed, well nourished, and in no acute distress.  ? ?MSK: *** ? ?Lab and Radiology Results ?No results found for this or any previous visit (from the past 72 hour(s)). ?No results found. ? ? ? ?Impression and Recommendations:   ? ?Assessment and Plan: ?46 y.o. female with ***. ? ?PDMP not reviewed this encounter. ?No orders of the defined types were placed in this encounter. ? ?No orders of the defined types were placed in this encounter. ? ? ?Discussed warning signs or symptoms. Please see discharge instructions. Patient expresses understanding. ? ? ?*** ?

## 2021-12-30 NOTE — Patient Instructions (Addendum)
Nice to meet you. ? ?I've ordered a stat MRI of your low back.   ? ?Please get an Xray today before you leave. ? ?Follow-up: after MRI results ?

## 2021-12-30 NOTE — Progress Notes (Signed)
? ?I, Philbert Riser, LAT, ATC acting as a scribe for [name], MD. ? ?Subjective:   ? ?CC: Low back pain ? ?HPI: Pt is a 46 y/o female c/o radiating R-sided pain ongoing since yesterday. Pt was just bending over to tie her shoes and felt pull in the R-side of her low back. Pt locates pain to R-side of her low back w/ radiating pain into predominantly the R leg, but slightly into the L leg. Pt works as a Veterinary surgeon. ?She notes significant leg weakness and difficulty walking.  She denies bowel or bladder dysfunction. ? ?Radiating pain: yes- mostly into R leg  ?LE numbness/tingling: yes- R foot ?LE weakness: yes ?Aggravates: anything ?Treatments tried: IBU, flexeril, tramadol, Tylenol ? ?Pertinent review of Systems: No fevers or chills.  Positive leg weakness. ? ?Relevant historical information: Prior history of right-sided sciatica ? ? ?Objective:   ? ?Vitals:  ? 12/30/21 1239  ?BP: (!) 142/86  ? ?General: Well Developed, well nourished, and in no acute distress.  ? ?MSK: L-spine: Nontender midline.  Decreased lumbar motion. ?Right lower extremity strength decreased to hip flexion and knee extension 3/5.  Otherwise intact bilateral lower extremities. ?Reflexes diminished right knee otherwise intact. ?Sensation is intact throughout. ? ? ?Lab and Radiology Results ? ?X-ray images L-spine obtained today personally and independently interpreted ?No acute fractures or severe malalignment.  No severe DDD ?Await formal radiology review ? ? ? ?Impression and Recommendations:   ? ?Assessment and Plan: ?46 y.o. female with right lumbar radiculopathy with right leg pain and weakness.  This occurred suddenly and is quite severe.  The rapidity of her pain and weakness and the severity of her pain and weakness is concerning.  Plan for stat MRI to further evaluate the possibility of right L2 or 3 lumbar radiculopathy.  She may have some right L4-5 component as well.  Based on the severity of her MRI may proceed directly to  surgery or epidural steroid injection.  Will prescribe medications as below, prednisone, gabapentin, and tramadol.  Additionally recommend use of walker.. ? ?Patient is tentatively scheduled for an MRI tomorrow at 11 AM at [facility] ? ?PDMP reviewed during this encounter. ?Orders Placed This Encounter  ?Procedures  ? DG Lumbar Spine Complete  ?  Standing Status:   Future  ?  Number of Occurrences:   1  ?  Standing Expiration Date:   01/30/2022  ?  Order Specific Question:   Reason for Exam (SYMPTOM  OR DIAGNOSIS REQUIRED)  ?  Answer:   low back pain  ?  Order Specific Question:   Is patient pregnant?  ?  Answer:   No  ?  Order Specific Question:   Preferred imaging location?  ?  [facility]  ? MR Lumbar Spine Wo Contrast  ?  Standing Status:   Future  ?  Standing Expiration Date:   12/31/2022  ?  Order Specific Question:   What is the patient's sedation requirement?  ?  Answer:   Anti-anxiety  ?  Order Specific Question:   Does the patient have a pacemaker or implanted devices?  ?  Answer:   No  ?  Order Specific Question:   Preferred imaging location?  ?  Answer:   [facility]  ? ?Meds ordered this encounter  ?Medications  ? predniSONE (DELTASONE) 50 MG tablet  ?  Sig: Take 1 tablet (50 mg total) by mouth daily.  ?  Dispense:  5 tablet  ?  Refill:  0  ? gabapentin (NEURONTIN) 300 MG capsule  ?  Sig: Take 1 capsule (300 mg total) by mouth 3 (three) times daily as needed.  ?  Dispense:  90 capsule  ?  Refill:  3  ? traMADol (ULTRAM) 50 MG tablet  ?  Sig: Take 1 tablet (50 mg total) by mouth every 8 (eight) hours as needed for severe pain.  ?  Dispense:  15 tablet  ?  Refill:  0  ? ? ?Discussed warning signs or symptoms. Please see discharge instructions. Patient expresses understanding. ? ? ?The above documentation has been reviewed and is accurate and complete [name], M.D. ? ?

## 2021-12-31 ENCOUNTER — Ambulatory Visit (HOSPITAL_BASED_OUTPATIENT_CLINIC_OR_DEPARTMENT_OTHER): Payer: BC Managed Care – PPO

## 2022-01-03 ENCOUNTER — Other Ambulatory Visit: Payer: BC Managed Care – PPO

## 2022-01-03 ENCOUNTER — Telehealth: Payer: Self-pay | Admitting: Family Medicine

## 2022-01-03 MED ORDER — TIZANIDINE HCL 4 MG PO TABS: 4.0000 mg | ORAL_TABLET | Freq: Four times a day (QID) | ORAL | 1 refills | Status: DC | PRN

## 2022-01-03 NOTE — Telephone Encounter (Signed)
She has improved quite a bit and is no longer experiencing significant weakness.  She says her pain is much more consistent with prior episode of piriformis syndrome.  She would like me to prescribe a muscle relaxer which I have done.  She would like to hold off of the MRI which given her improving strength I think is reasonable.  I discussed precautions.  I did recommend physical therapy.  She already is starting back with a personal trainer who she has seen previously for this issue.  We discussed some precautions. ?

## 2022-01-03 NOTE — Progress Notes (Signed)
Lumbar spine x-ray looks normal to radiology.

## 2022-01-03 NOTE — Telephone Encounter (Signed)
Patient called over the weekend and left a message stating that her nerve pain has resolved but she is having muscle spasms that are "crippling". She asked if Dr Jade Sims would be willing to prescribe a muscle relaxer for her? ? ?Please advise. ? ? ?

## 2022-02-09 ENCOUNTER — Other Ambulatory Visit (HOSPITAL_COMMUNITY): Payer: Self-pay

## 2022-02-09 ENCOUNTER — Other Ambulatory Visit: Payer: Self-pay | Admitting: Internal Medicine

## 2022-02-09 DIAGNOSIS — E118 Type 2 diabetes mellitus with unspecified complications: Secondary | ICD-10-CM

## 2022-02-09 MED ORDER — MOUNJARO 7.5 MG/0.5ML ~~LOC~~ SOAJ
SUBCUTANEOUS | 0 refills | Status: DC
  Filled 2023-01-12: qty 2, 28d supply, fill #0

## 2022-02-09 MED ORDER — MOUNJARO 2.5 MG/0.5ML ~~LOC~~ SOAJ
SUBCUTANEOUS | 0 refills | Status: DC
  Filled 2022-02-09: qty 2, 28d supply, fill #0

## 2022-02-09 MED ORDER — MOUNJARO 5 MG/0.5ML ~~LOC~~ SOAJ: SUBCUTANEOUS | 0 refills | Status: DC

## 2022-02-16 ENCOUNTER — Other Ambulatory Visit (HOSPITAL_COMMUNITY): Payer: Self-pay

## 2022-02-25 ENCOUNTER — Other Ambulatory Visit: Payer: Self-pay | Admitting: Neurosurgery

## 2022-02-25 DIAGNOSIS — M5440 Lumbago with sciatica, unspecified side: Secondary | ICD-10-CM

## 2022-03-14 ENCOUNTER — Inpatient Hospital Stay: Admission: RE | Admit: 2022-03-14 | Payer: BC Managed Care – PPO | Source: Ambulatory Visit

## 2022-03-17 ENCOUNTER — Other Ambulatory Visit: Payer: BC Managed Care – PPO

## 2022-06-26 ENCOUNTER — Encounter (HOSPITAL_COMMUNITY): Payer: Self-pay

## 2022-06-26 ENCOUNTER — Emergency Department (HOSPITAL_COMMUNITY): Payer: BC Managed Care – PPO

## 2022-06-26 ENCOUNTER — Emergency Department (HOSPITAL_COMMUNITY)
Admission: EM | Admit: 2022-06-26 | Discharge: 2022-06-26 | Disposition: A | Discharge status: Home / Self Care | Payer: BC Managed Care – PPO | Attending: Emergency Medicine | Admitting: Emergency Medicine

## 2022-06-26 DIAGNOSIS — R110 Nausea: Secondary | ICD-10-CM | POA: Diagnosis not present

## 2022-06-26 DIAGNOSIS — R1011 Right upper quadrant pain: Secondary | ICD-10-CM | POA: Diagnosis not present

## 2022-06-26 DIAGNOSIS — Z7982 Long term (current) use of aspirin: Secondary | ICD-10-CM | POA: Insufficient documentation

## 2022-06-26 DIAGNOSIS — E119 Type 2 diabetes mellitus without complications: Secondary | ICD-10-CM | POA: Diagnosis not present

## 2022-06-26 DIAGNOSIS — Z91040 Latex allergy status: Secondary | ICD-10-CM | POA: Insufficient documentation

## 2022-06-26 DIAGNOSIS — Z7984 Long term (current) use of oral hypoglycemic drugs: Secondary | ICD-10-CM | POA: Diagnosis not present

## 2022-06-26 LAB — COMPREHENSIVE METABOLIC PANEL
ALT: 16 U/L (ref 0–44)
AST: 17 U/L (ref 15–41)
Albumin: 3.6 g/dL (ref 3.5–5.0)
Alkaline Phosphatase: 65 U/L (ref 38–126)
Anion gap: 6 (ref 5–15)
BUN: 14 mg/dL (ref 6–20)
CO2: 26 mmol/L (ref 22–32)
Calcium: 8.8 mg/dL — ABNORMAL LOW (ref 8.9–10.3)
Chloride: 108 mmol/L (ref 98–111)
Creatinine, Ser: 0.6 mg/dL (ref 0.44–1.00)
GFR, Estimated: >60 mL/min (ref >60)
Glucose, Bld: 128 mg/dL — ABNORMAL HIGH (ref 70–99)
Potassium: 4.2 mmol/L (ref 3.5–5.1)
Sodium: 140 mmol/L (ref 135–145)
Total Bilirubin: 0.4 mg/dL (ref 0.3–1.2)
Total Protein: 7.2 g/dL (ref 6.5–8.1)

## 2022-06-26 LAB — URINALYSIS, ROUTINE W REFLEX MICROSCOPIC
Bilirubin Urine: NEGATIVE
Glucose, UA: NEGATIVE mg/dL
Hgb urine dipstick: NEGATIVE
Ketones, ur: NEGATIVE mg/dL
Leukocytes,Ua: NEGATIVE
Nitrite: NEGATIVE
Protein, ur: NEGATIVE mg/dL
Specific Gravity, Urine: 1.02 (ref 1.005–1.030)
pH: 5 (ref 5.0–8.0)

## 2022-06-26 LAB — CBC WITH DIFFERENTIAL/PLATELET
Abs Immature Granulocytes: 0.04 10*3/uL (ref 0.00–0.07)
Basophils Absolute: 0 10*3/uL (ref 0.0–0.1)
Basophils Relative: 0 %
Eosinophils Absolute: 0.2 10*3/uL (ref 0.0–0.5)
Eosinophils Relative: 2 %
HCT: 38.7 % (ref 36.0–46.0)
Hemoglobin: 12.4 g/dL (ref 12.0–15.0)
Immature Granulocytes: 0 %
Lymphocytes Relative: 30 %
Lymphs Abs: 3 10*3/uL (ref 0.7–4.0)
MCH: 30.5 pg (ref 26.0–34.0)
MCHC: 32 g/dL (ref 30.0–36.0)
MCV: 95.3 fL (ref 80.0–100.0)
Monocytes Absolute: 0.6 10*3/uL (ref 0.1–1.0)
Monocytes Relative: 7 %
Neutro Abs: 6 10*3/uL (ref 1.7–7.7)
Neutrophils Relative %: 61 %
Platelets: 323 10*3/uL (ref 150–400)
RBC: 4.06 MIL/uL (ref 3.87–5.11)
RDW: 14.2 % (ref 11.5–15.5)
WBC: 9.9 10*3/uL (ref 4.0–10.5)
nRBC: 0 % (ref 0.0–0.2)

## 2022-06-26 LAB — I-STAT BETA HCG BLOOD, ED (MC, WL, AP ONLY): I-stat hCG, quantitative: <5 m[IU]/mL (ref <5)

## 2022-06-26 LAB — LIPASE, BLOOD: Lipase: 28 U/L (ref 11–51)

## 2022-06-26 MED ORDER — PANTOPRAZOLE SODIUM 40 MG PO TBEC: 40.0000 mg | DELAYED_RELEASE_TABLET | Freq: Every day | ORAL | 2 refills | Status: AC

## 2022-06-26 NOTE — ED Triage Notes (Signed)
Pt arrived via POV, c/o abd pain, epigastric. States she stopped taking some OTC supplements, started having some SOB and feeling panicked after that. Abd pain cont.

## 2022-06-26 NOTE — Discharge Instructions (Signed)
You were seen in the emergency department for a right upper quadrant abdominal pain.  You had lab work urinalysis and a right upper quadrant ultrasound that did not show an obvious cause of your symptoms.  Your common bile duct was slightly enlarged but no gallstones were seen.  This will need follow-up with your primary care doctor and possible referral to GI.  We are starting you on some acid medication.  Please start with a low-fat diet.  Return to the emergency department if any high fevers or uncontrolled pain.

## 2022-06-26 NOTE — ED Notes (Signed)
Pt. Left without being discharged by the RN and before getting a last set of VS. Discharge instructions were not reviewed with pt. By RN.

## 2022-06-26 NOTE — ED Provider Notes (Signed)
[facility]-EMERGENCY DEPT Provider Note   CSN: [id number] Arrival date & time: 06/26/22  1557     History  Chief Complaint  Patient presents with   Abdominal Pain    Jade Sims is a 46 y.o. female.  She has a history of diabetes.  Complaining of 1 week of waves of pain in her right upper quadrant radiating into her right shoulder and back.  Does not seem to be affected by eating.  Nausea no vomiting.  No diarrhea or constipation.  No fevers or chills.  Pain was worse today although since this resolved.  She thought it may be related to spicy foods or supplements that she has been taking.  Her medication list says she was taking Mounjaro but she said she has not taken that in a while.  The history is provided by the patient.  Abdominal Pain Pain location:  RUQ Pain quality: aching   Pain radiates to:  R shoulder and back Pain severity:  Moderate Onset quality:  Gradual Duration:  1 week Timing:  Intermittent Progression:  Unchanged Chronicity:  New Context: not trauma   Relieved by:  None tried Worsened by:  Nothing Ineffective treatments:  None tried Associated symptoms: nausea   Associated symptoms: no chest pain, no chills, no constipation, no cough, no diarrhea, no dysuria, no fever, no hematemesis, no hematochezia, no hematuria, no shortness of breath and no vomiting        Home Medications Prior to Admission medications   Medication Sig Start Date End Date Taking? Authorizing Provider  aspirin-sod bicarb-citric acid (ALKA-SELTZER) 325 MG TBEF tablet Take 325 mg by mouth every 6 (six) hours as needed (reflux).    [provider]  gabapentin (NEURONTIN) 300 MG capsule Take 1 capsule (300 mg total) by mouth 3 (three) times daily as needed. 12/30/21   Rodolph Bong, MD  ibuprofen (ADVIL,MOTRIN) 200 MG tablet Take 400-800 mg by mouth every 6 (six) hours as needed for headache, mild pain or moderate pain.    [provider]   LORazepam (ATIVAN) 0.5 MG tablet 1-2 tabs 30 - 60 min prior to MRI. Do not drive with this medicine. 12/30/21   Rodolph Bong, MD  losartan (COZAAR) 50 MG tablet Take 50 mg by mouth daily.    [provider]  metFORMIN (GLUCOPHAGE) 850 MG tablet Take 850 mg by mouth 2 (two) times daily with a meal.    [provider]  Multiple Vitamins-Minerals (MULTIVITAMIN ADULT PO) Take 1 tablet by mouth daily.    [provider]  predniSONE (DELTASONE) 50 MG tablet Take 1 tablet (50 mg total) by mouth daily. 12/30/21   Rodolph Bong, MD  tirzepatide [facility]) 2.5 MG/0.5ML Pen Inject 0.5 mls into the skin once weekly 02/09/22     tirzepatide [facility]) 5 MG/0.5ML Pen 5 mg Subcutaneous weekly 28 days 02/09/22     tirzepatide (MOUNJARO) 7.5 MG/0.5ML Pen Inject 7.5 mg Subcutaneous weekly 28 days 02/09/22     tiZANidine (ZANAFLEX) 4 MG tablet Take 1 tablet (4 mg total) by mouth every 6 (six) hours as needed for muscle spasms. 01/03/22   Rodolph Bong, MD  traMADol (ULTRAM) 50 MG tablet Take 1 tablet (50 mg total) by mouth every 8 (eight) hours as needed for severe pain. 12/30/21   Rodolph Bong, MD  vitamin C (ASCORBIC ACID) 500 MG tablet Take 500 mg by mouth daily.    [provider]  Allergies    Latex    Review of Systems   Review of Systems  Constitutional:  Negative for chills and fever.  Respiratory:  Negative for cough and shortness of breath.   Cardiovascular:  Negative for chest pain.  Gastrointestinal:  Positive for abdominal pain and nausea. Negative for constipation, diarrhea, hematemesis, hematochezia and vomiting.  Genitourinary:  Negative for dysuria and hematuria.    Physical Exam Updated Vital Signs BP (!) 147/98   Pulse 72   Temp 98.2 F (36.8 C) (Oral)   Resp 17   Ht 5\' 6"  (1.676 m)   Wt (!) 155.1 kg   SpO2 100%   BMI 55.20 kg/m  Physical Exam Vitals and nursing note reviewed.  Constitutional:      General: She is not in acute distress.     Appearance: Normal appearance. She is well-developed. She is obese.  HENT:     Head: Normocephalic and atraumatic.  Eyes:     Conjunctiva/sclera: Conjunctivae normal.  Cardiovascular:     Rate and Rhythm: Normal rate and regular rhythm.     Heart sounds: No murmur heard. Pulmonary:     Effort: Pulmonary effort is normal. No respiratory distress.     Breath sounds: Normal breath sounds.  Abdominal:     Palpations: Abdomen is soft.     Tenderness: There is no abdominal tenderness. There is no guarding or rebound.  Musculoskeletal:        General: No swelling.     Cervical back: Neck supple.  Skin:    General: Skin is warm and dry.     Capillary Refill: Capillary refill takes less than 2 seconds.  Neurological:     General: No focal deficit present.     Mental Status: She is alert.     ED Results / Procedures / Treatments   Labs (all labs ordered are listed, but only abnormal results are displayed) Labs Reviewed  COMPREHENSIVE METABOLIC PANEL - Abnormal; Notable for the following components:      Result Value   Glucose, Bld 128 (*)    Calcium 8.8 (*)    All other components within normal limits  CBC WITH DIFFERENTIAL/PLATELET  LIPASE, BLOOD  URINALYSIS, ROUTINE W REFLEX MICROSCOPIC  I-STAT BETA HCG BLOOD, ED ([facility], [facility], [facility] ONLY)    EKG EKG Interpretation  Date/Time:  Sunday June 26 2022 16:16:06 EDT Ventricular Rate:  77 PR Interval:  134 QRS Duration: 96 QT Interval:  383 QTC Calculation: 434 R Axis:   46 Text Interpretation: Sinus rhythm No significant change since prior 4/21 Confirmed by Aletta Edouard [phone]) on 06/27/2022 10:45:53 AM  Radiology US Abdomen Limited RUQ (LIVER/GB)  Result Date: 06/26/2022 CLINICAL DATA:  Abdomen pain EXAM: ULTRASOUND ABDOMEN LIMITED RIGHT UPPER QUADRANT COMPARISON:  CT 08/04/2011 FINDINGS: Gallbladder: No gallstones or wall thickening visualized. No sonographic Murphy sign noted by sonographer. Common bile duct: Diameter:  Dilated up to 9 mm Liver: Diffusely echogenic. Focal hypoechoic region near gallbladder fossa probable fat sparing. Portal vein is patent on color Doppler imaging with normal direction of blood flow towards the liver. Other: None. IMPRESSION: 1. Negative for gallstones 2. Dilated common bile duct up to 9 mm, correlate with LFTs with follow-up MRCP if ductal obstruction is suspected 3. Hepatic steatosis with probable fat sparing near the gallbladder fossa Electronically Signed   By: Donavan Foil M.D.   On: 06/26/2022 17:21    Procedures Procedures    Medications Ordered in ED Medications - No data to  display  ED Course/ Medical Decision Making/ A&P Clinical Course as of 06/27/22 1044  Sun Jun 26, 2022  2118 Patient's work-up has been unremarkable other than ultrasound showing enlarged CBD.  Reviewed this with GI Dr. Bosie Clos who said with normal LFTs he did not feel she needed any emergent intervention with this.  Recommended follow-up outpatient. [MB]    Clinical Course User Index [MB] Terrilee Files, MD                           Medical Decision Making Risk Prescription drug management.   This patient complains of right upper quadrant pain into her shoulder and back; this involves an extensive number of treatment Options and is a complaint that carries with it a high risk of complications and morbidity. The differential includes cholelithiasis, cholecystitis, peptic ulcer disease, gastritis  I ordered, reviewed and interpreted labs, which included CBC normal, chemistries normal other than mildly elevated glucose, LFTs lipase and urinalysis pregnancy test negative  I ordered imaging studies which included right upper quadrant ultrasound and I independently    visualized and interpreted imaging which showed no signs of gallstones, does have slightly elevated CBD Additional history obtained from patient's husband Previous records obtained and reviewed in epic no recent admissions I  consulted Dr. Bosie Clos GI and discussed lab and imaging findings and discussed disposition.  Cardiac monitoring reviewed, normal sinus rhythm Social determinants considered, no significant barriers Critical Interventions: None  After the interventions stated above, I reevaluated the patient and found patient to be pain-free symptom-free Admission and further testing considered, review results of work-up with her.  No indications for admission at this time.  Recommended close follow-up with PCP as she may need further testing such as HIDA scan or GI follow-up.  We will start her on PPI.  Return instructions discussed         Final Clinical Impression(s) / ED Diagnoses Final diagnoses:  RUQ abdominal pain    Rx / DC Orders ED Discharge Orders          Ordered    pantoprazole (PROTONIX) 40 MG tablet  Daily        06/26/22 2124              Terrilee Files, MD 06/27/22 1047

## 2022-06-26 NOTE — ED Provider Triage Note (Signed)
Emergency Medicine Provider Triage Evaluation Note  LAVERE SHINSKY , a 46 y.o. female  was evaluated in triage.  Pt complains of right upper quadrant abdominal pain.  Noticed that approximately 1 week ago.  Has been progressively getting worse.  Began as intermittent pain, but has become mostly constant.  Does have nausea when she has pain.  No vomiting, diarrhea, chills.  Pain does radiate to her back and her right shoulder.  Review of Systems  Positive: As above Negative: As above  Physical Exam  BP (!) 159/98   Pulse 77   Temp 98.2 F (36.8 C) (Oral)   Resp 17   Ht 5\' 6"  (1.676 m)   Wt (!) 155.1 kg   SpO2 98%   BMI 55.20 kg/m  Gen:   Awake, no distress   Resp:  Normal effort  MSK:   Moves extremities without difficulty  Other:  right upper quadrant tenderness.  Murphy sign positive.  Medical Decision Making  Medically screening exam initiated at 4:36 PM.  Appropriate orders placed.  KEMESHA MOSEY was informed that the remainder of the evaluation will be completed by another provider, this initial triage assessment does not replace that evaluation, and the importance of remaining in the ED until their evaluation is complete.  ED abdominal pain labs and right upper quadrant ultrasound.  History most correlated with biliary colic.   Roylene Reason, [place] 06/26/22 1638

## 2022-08-02 ENCOUNTER — Other Ambulatory Visit (HOSPITAL_COMMUNITY): Payer: Self-pay | Admitting: Internal Medicine

## 2022-08-02 DIAGNOSIS — K219 Gastro-esophageal reflux disease without esophagitis: Secondary | ICD-10-CM

## 2022-08-10 ENCOUNTER — Encounter (HOSPITAL_COMMUNITY): Payer: Self-pay

## 2022-08-10 ENCOUNTER — Encounter (HOSPITAL_COMMUNITY): Payer: BC Managed Care – PPO | Attending: Internal Medicine

## 2022-09-01 IMAGING — DX DG LUMBAR SPINE COMPLETE 4+V
5 series · 5 of 5 positions shown · non-contrast
Comparison: X-ray lumbar 06/03/2005.

CLINICAL DATA: Right-sided lower back pain 1 day after bending to
pick up shoes.

EXAM:
LUMBAR SPINE - COMPLETE 4+ VIEW

[l-spine ap]
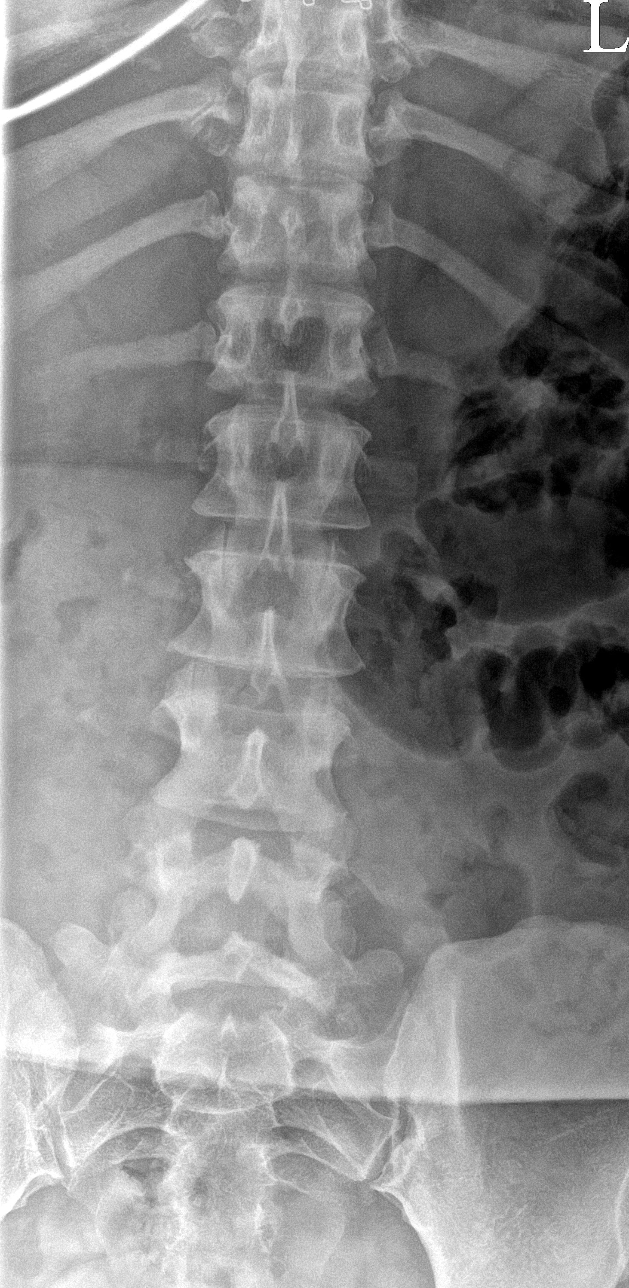

[l-spine obl (1 of 2)]
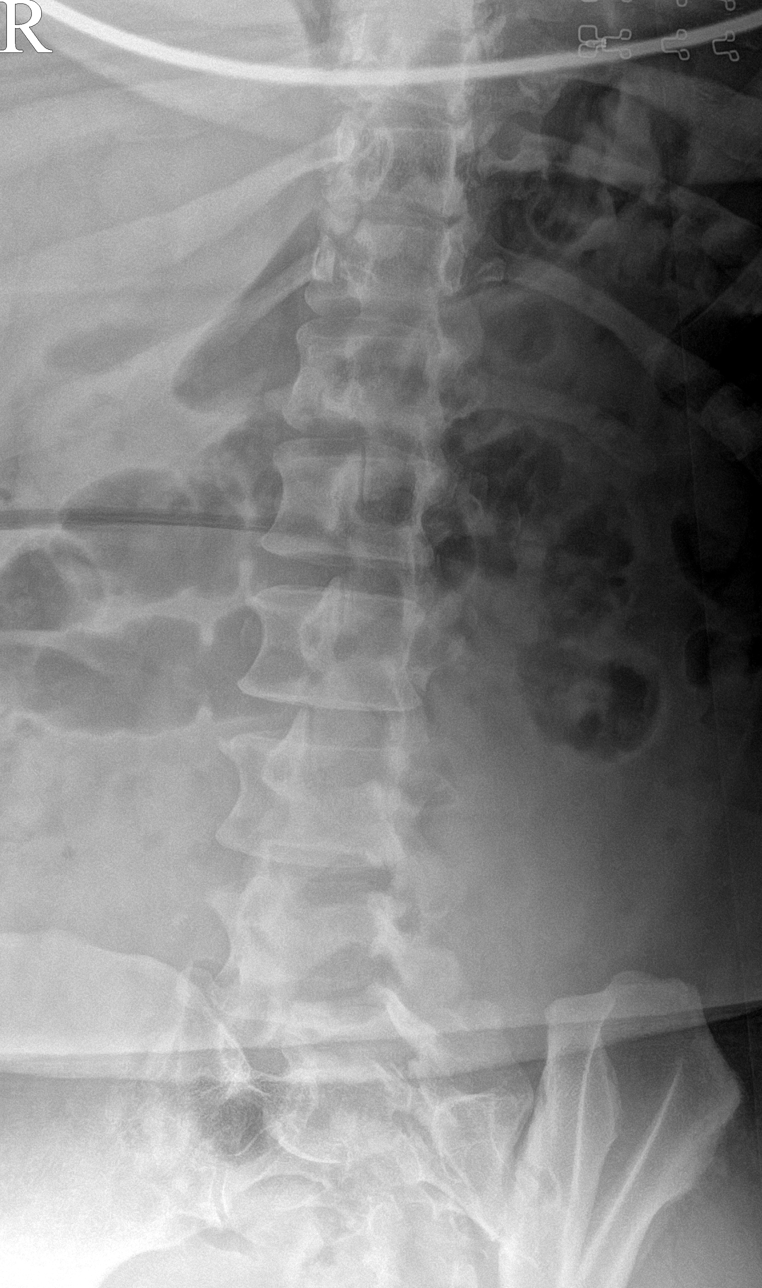

[l-spine obl (2 of 2)]
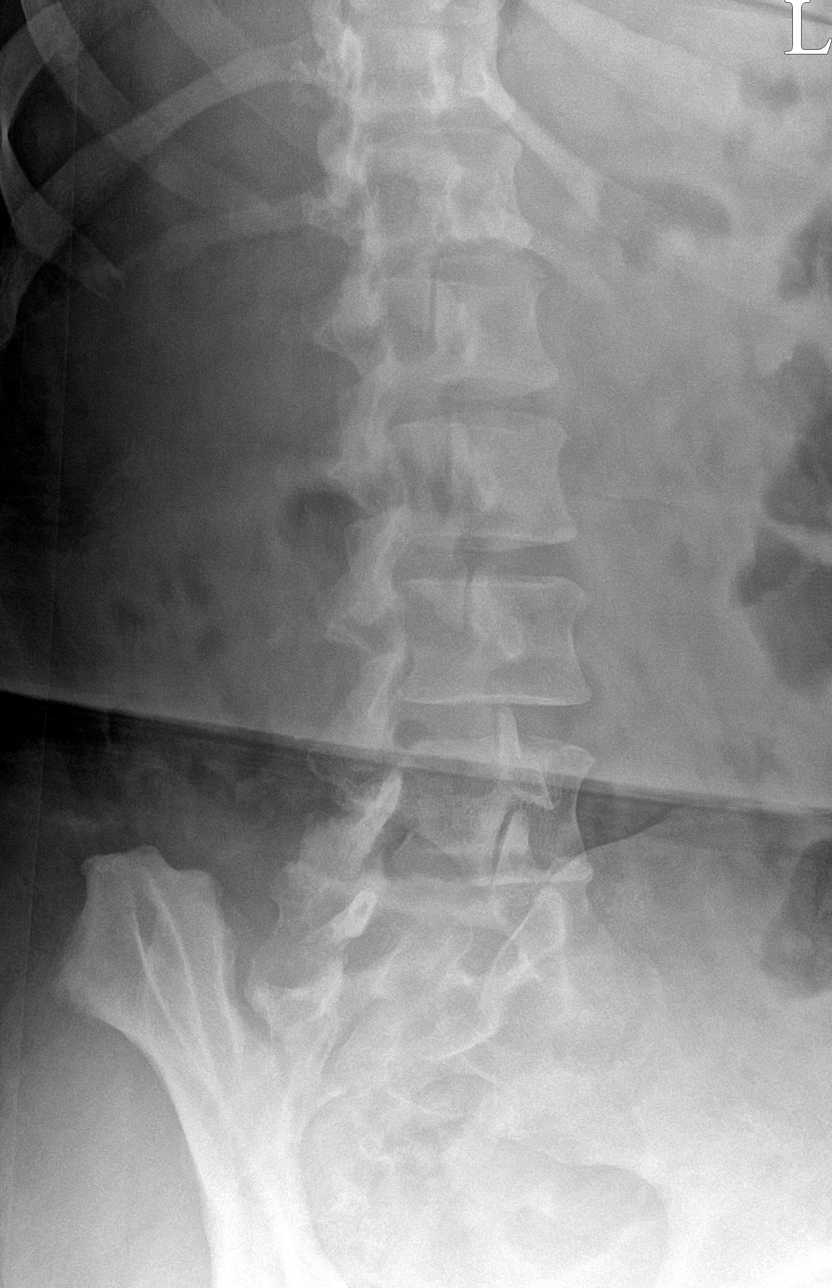

[l-spine lateral]
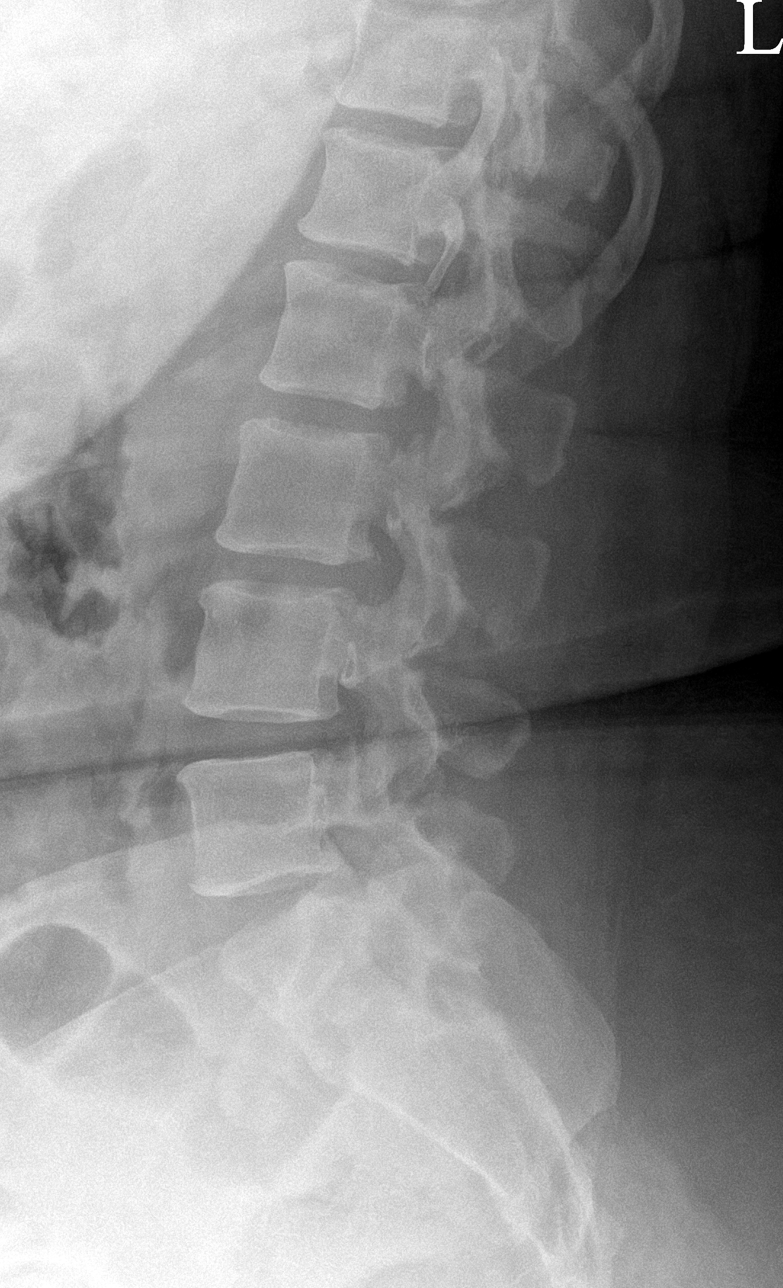

[l-spine spot]
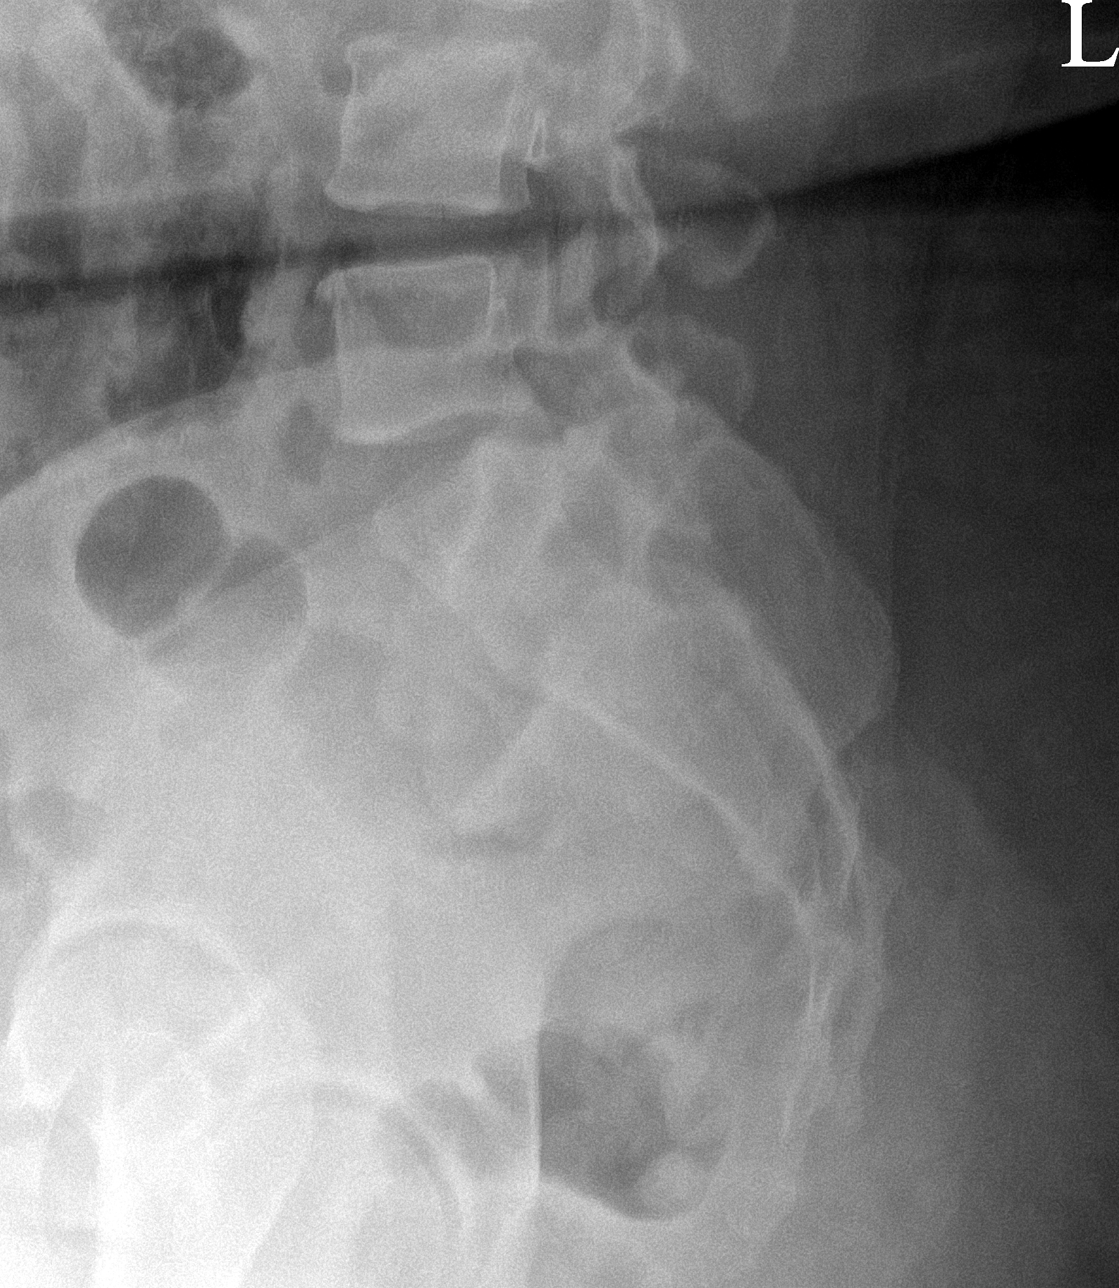

[5 of 5 positions shown; findings below may reference images not displayed]

FINDINGS: There is no evidence of lumbar spine fracture. Alignment is normal.
Intervertebral disc spaces are maintained.
IMPRESSION: Negative.

## 2023-01-12 ENCOUNTER — Other Ambulatory Visit (HOSPITAL_COMMUNITY): Payer: Self-pay

## 2023-01-12 MED ORDER — MOUNJARO 2.5 MG/0.5ML ~~LOC~~ SOAJ
2.5000 mg | SUBCUTANEOUS | 0 refills | Status: DC
  Filled 2023-01-12: qty 2, 28d supply, fill #0

## 2023-01-12 MED ORDER — MOUNJARO 5 MG/0.5ML ~~LOC~~ SOAJ
5.0000 mg | SUBCUTANEOUS | 0 refills | Status: DC
  Filled 2023-02-04 – 2023-06-02 (×3): qty 2, 28d supply, fill #0

## 2023-01-12 MED ORDER — MOUNJARO 7.5 MG/0.5ML ~~LOC~~ SOAJ
7.5000 mg | SUBCUTANEOUS | 0 refills | Status: DC
  Filled 2023-04-01: qty 2, 28d supply, fill #0

## 2023-01-14 ENCOUNTER — Other Ambulatory Visit (HOSPITAL_COMMUNITY): Payer: Self-pay

## 2023-01-14 MED ORDER — MOUNJARO 7.5 MG/0.5ML ~~LOC~~ SOAJ
SUBCUTANEOUS | 0 refills | Status: DC
  Filled 2023-06-02 – 2023-08-10 (×2): qty 2, 28d supply, fill #0

## 2023-01-14 MED ORDER — MOUNJARO 2.5 MG/0.5ML ~~LOC~~ SOAJ
SUBCUTANEOUS | 0 refills | Status: DC
  Filled 2023-01-14: qty 2, 28d supply, fill #0

## 2023-01-14 MED ORDER — MOUNJARO 5 MG/0.5ML ~~LOC~~ SOAJ
SUBCUTANEOUS | 0 refills | Status: DC
  Filled 2023-01-28 – 2023-02-10 (×2): qty 2, 28d supply, fill #0

## 2023-01-17 ENCOUNTER — Other Ambulatory Visit (HOSPITAL_COMMUNITY): Payer: Self-pay

## 2023-01-17 MED ORDER — MOUNJARO 2.5 MG/0.5ML ~~LOC~~ SOAJ
2.5000 mg | SUBCUTANEOUS | 0 refills | Status: DC
  Filled 2023-01-17 – 2023-02-04 (×3): qty 2, 28d supply, fill #0

## 2023-01-28 ENCOUNTER — Other Ambulatory Visit (HOSPITAL_COMMUNITY): Payer: Self-pay

## 2023-01-30 ENCOUNTER — Other Ambulatory Visit (HOSPITAL_COMMUNITY): Payer: Self-pay

## 2023-01-30 MED ORDER — LOSARTAN POTASSIUM 50 MG PO TABS
50.0000 mg | ORAL_TABLET | Freq: Every day | ORAL | 3 refills | Status: AC
  Filled 2023-01-30: qty 90, 90d supply, fill #0

## 2023-02-03 ENCOUNTER — Other Ambulatory Visit (HOSPITAL_COMMUNITY): Payer: Self-pay

## 2023-02-03 MED ORDER — LORAZEPAM 0.5 MG PO TABS
0.5000 mg | ORAL_TABLET | Freq: Every evening | ORAL | 1 refills | Status: AC
  Filled 2023-02-03: qty 90, 90d supply, fill #0

## 2023-02-03 MED ORDER — METFORMIN HCL ER 500 MG PO TB24
1000.0000 mg | ORAL_TABLET | Freq: Two times a day (BID) | ORAL | 1 refills | Status: AC
  Filled 2023-02-03: qty 360, 90d supply, fill #0
  Filled 2024-01-02: qty 360, 90d supply, fill #1

## 2023-02-03 MED ORDER — LOSARTAN POTASSIUM 50 MG PO TABS
50.0000 mg | ORAL_TABLET | Freq: Every day | ORAL | 1 refills | Status: DC
  Filled 2023-02-03: qty 90, 90d supply, fill #0
  Filled 2023-06-07 – 2023-06-19 (×2): qty 90, 90d supply, fill #1

## 2023-02-03 MED ORDER — MOUNJARO 2.5 MG/0.5ML ~~LOC~~ SOAJ
2.5000 mg | SUBCUTANEOUS | 1 refills | Status: DC
  Filled 2023-02-03 – 2023-02-04 (×2): qty 2, 28d supply, fill #0

## 2023-02-03 MED ORDER — ESCITALOPRAM OXALATE 10 MG PO TABS
10.0000 mg | ORAL_TABLET | Freq: Every day | ORAL | 1 refills | Status: AC
  Filled 2023-02-03: qty 90, 90d supply, fill #0

## 2023-02-04 ENCOUNTER — Other Ambulatory Visit (HOSPITAL_COMMUNITY): Payer: Self-pay

## 2023-02-08 ENCOUNTER — Other Ambulatory Visit (HOSPITAL_COMMUNITY): Payer: Self-pay

## 2023-02-10 ENCOUNTER — Other Ambulatory Visit (HOSPITAL_COMMUNITY): Payer: Self-pay

## 2023-02-15 ENCOUNTER — Other Ambulatory Visit (HOSPITAL_COMMUNITY): Payer: Self-pay

## 2023-02-22 ENCOUNTER — Other Ambulatory Visit (HOSPITAL_COMMUNITY): Payer: Self-pay

## 2023-04-01 ENCOUNTER — Other Ambulatory Visit (HOSPITAL_COMMUNITY): Payer: Self-pay

## 2023-04-17 ENCOUNTER — Other Ambulatory Visit (HOSPITAL_COMMUNITY): Payer: Self-pay

## 2023-04-17 MED ORDER — MOUNJARO 12.5 MG/0.5ML ~~LOC~~ SOAJ: 12.5000 mg | SUBCUTANEOUS | 0 refills | Status: DC

## 2023-04-17 MED ORDER — MOUNJARO 10 MG/0.5ML ~~LOC~~ SOAJ
10.0000 mg | SUBCUTANEOUS | 0 refills | Status: DC
  Filled 2023-04-17: qty 2, 28d supply, fill #0

## 2023-04-17 MED ORDER — MOUNJARO 15 MG/0.5ML ~~LOC~~ SOAJ: 15.0000 mg | SUBCUTANEOUS | 0 refills | Status: DC

## 2023-05-18 ENCOUNTER — Other Ambulatory Visit (HOSPITAL_COMMUNITY): Payer: Self-pay

## 2023-05-19 ENCOUNTER — Other Ambulatory Visit (HOSPITAL_COMMUNITY): Payer: Self-pay

## 2023-05-19 MED ORDER — MOUNJARO 10 MG/0.5ML ~~LOC~~ SOAJ
10.0000 mg | SUBCUTANEOUS | 0 refills | Status: DC
  Filled 2023-05-19 (×2): qty 2, 28d supply, fill #0

## 2023-05-26 ENCOUNTER — Other Ambulatory Visit (HOSPITAL_COMMUNITY): Payer: Self-pay

## 2023-06-02 ENCOUNTER — Other Ambulatory Visit (HOSPITAL_COMMUNITY): Payer: Self-pay

## 2023-06-02 MED ORDER — MOUNJARO 5 MG/0.5ML ~~LOC~~ SOAJ: 5.0000 mg | SUBCUTANEOUS | 1 refills | Status: DC

## 2023-06-02 MED ORDER — MOUNJARO 5 MG/0.5ML ~~LOC~~ SOAJ
5.0000 mg | SUBCUTANEOUS | 1 refills | Status: DC
  Filled 2023-06-02 – 2023-10-10 (×4): qty 2, 28d supply, fill #0
  Filled 2023-11-04: qty 2, 28d supply, fill #1

## 2023-06-06 ENCOUNTER — Other Ambulatory Visit (HOSPITAL_COMMUNITY): Payer: Self-pay

## 2023-06-07 ENCOUNTER — Other Ambulatory Visit (HOSPITAL_COMMUNITY): Payer: Self-pay

## 2023-06-07 MED ORDER — MOUNJARO 5 MG/0.5ML ~~LOC~~ SOAJ
5.0000 mg | SUBCUTANEOUS | 2 refills | Status: DC
  Filled 2023-06-07 – 2023-06-19 (×3): qty 2, 28d supply, fill #0
  Filled 2023-07-11 – 2023-07-14 (×2): qty 2, 28d supply, fill #1

## 2023-06-08 ENCOUNTER — Other Ambulatory Visit (HOSPITAL_COMMUNITY): Payer: Self-pay

## 2023-06-12 ENCOUNTER — Other Ambulatory Visit (HOSPITAL_COMMUNITY): Payer: Self-pay

## 2023-06-16 ENCOUNTER — Other Ambulatory Visit (HOSPITAL_COMMUNITY): Payer: Self-pay

## 2023-06-19 ENCOUNTER — Other Ambulatory Visit (HOSPITAL_COMMUNITY): Payer: Self-pay

## 2023-07-06 ENCOUNTER — Other Ambulatory Visit (HOSPITAL_COMMUNITY): Payer: Self-pay

## 2023-07-06 MED ORDER — LOSARTAN POTASSIUM-HCTZ 100-12.5 MG PO TABS
1.0000 | ORAL_TABLET | Freq: Every day | ORAL | 11 refills | Status: DC
  Filled 2023-07-06: qty 30, 30d supply, fill #0
  Filled 2023-08-10: qty 30, 30d supply, fill #1

## 2023-07-10 ENCOUNTER — Other Ambulatory Visit (HOSPITAL_COMMUNITY): Payer: Self-pay

## 2023-07-11 ENCOUNTER — Other Ambulatory Visit (HOSPITAL_COMMUNITY): Payer: Self-pay

## 2023-07-11 MED ORDER — NEOMYCIN-POLYMYXIN-DEXAMETH 0.1 % OP SUSP
1.0000 [drp] | Freq: Four times a day (QID) | OPHTHALMIC | 0 refills | Status: AC
  Filled 2023-07-11: qty 5, 25d supply, fill #0

## 2023-07-12 ENCOUNTER — Other Ambulatory Visit: Payer: Self-pay | Admitting: Internal Medicine

## 2023-07-12 DIAGNOSIS — E348 Other specified endocrine disorders: Secondary | ICD-10-CM

## 2023-07-14 ENCOUNTER — Other Ambulatory Visit: Payer: Self-pay

## 2023-08-04 ENCOUNTER — Other Ambulatory Visit (HOSPITAL_COMMUNITY): Payer: Self-pay

## 2023-08-04 MED ORDER — TINIDAZOLE 500 MG PO TABS
1000.0000 mg | ORAL_TABLET | Freq: Every day | ORAL | 0 refills | Status: AC
  Filled 2023-08-04: qty 10, 5d supply, fill #0

## 2023-08-04 MED ORDER — FLUCONAZOLE 150 MG PO TABS
ORAL_TABLET | ORAL | 0 refills | Status: DC
  Filled 2023-08-04: qty 3, 7d supply, fill #0

## 2023-08-10 ENCOUNTER — Other Ambulatory Visit (HOSPITAL_COMMUNITY): Payer: Self-pay

## 2023-08-10 MED ORDER — LOSARTAN POTASSIUM-HCTZ 100-12.5 MG PO TABS
1.0000 | ORAL_TABLET | Freq: Every day | ORAL | 3 refills | Status: AC
  Filled 2023-08-10 – 2023-09-07 (×2): qty 90, 90d supply, fill #0
  Filled 2023-12-07: qty 90, 90d supply, fill #1
  Filled 2024-05-15 (×2): qty 90, 90d supply, fill #2

## 2023-08-11 ENCOUNTER — Other Ambulatory Visit (HOSPITAL_COMMUNITY): Payer: Self-pay

## 2023-08-23 ENCOUNTER — Ambulatory Visit: Payer: BC Managed Care – PPO | Admitting: Family Medicine

## 2023-08-23 ENCOUNTER — Ambulatory Visit (INDEPENDENT_AMBULATORY_CARE_PROVIDER_SITE_OTHER): Payer: BC Managed Care – PPO

## 2023-08-23 ENCOUNTER — Other Ambulatory Visit: Payer: Self-pay

## 2023-08-23 VITALS — BP 126/82 | HR 89 | Ht 66.0 in | Wt 318.0 lb

## 2023-08-23 DIAGNOSIS — M25561 Pain in right knee: Secondary | ICD-10-CM | POA: Diagnosis not present

## 2023-08-23 DIAGNOSIS — M25562 Pain in left knee: Secondary | ICD-10-CM | POA: Diagnosis not present

## 2023-08-23 DIAGNOSIS — M170 Bilateral primary osteoarthritis of knee: Secondary | ICD-10-CM | POA: Diagnosis not present

## 2023-08-23 DIAGNOSIS — G8929 Other chronic pain: Secondary | ICD-10-CM

## 2023-08-23 NOTE — Progress Notes (Signed)
Jade Payor, PhD, LAT, ATC acting as a scribe for Jade Graham, MD.  Jade Sims is a 47 y.o. female who presents to [facility] today for bilat knee pain. Pt was previously seen by Dr. Denyse Amass on 12/30/21 for LBP.  Today, pt c/o bilat knee pain, L>R, x 3 years, worsening lately. Pt locates pain to the anterior aspect of her R knee. L knee only hurts when it buckles. She does a lot gardening. She works as a Veterinary surgeon.  She notes locking and catching sensation especially in the left knee.  Knee swelling: no Mechanical symptoms: yes Aggravates: walking- esp quickly Treatments tried: creams, Move-well supplement.  Oral hyaluronic acid supplementation. Additionally she is actively losing weight with her PCP.  She is lost 30 pounds afar.  Pertinent review of systems: No fevers or chills  Relevant historical information: Hypertension   Exam:  BP 126/82   Pulse 89   Ht 5\' 6"  (1.676 m)   Wt (!) 318 lb (144.2 kg)   SpO2 96%   BMI 51.33 kg/m  General: Well Developed, well nourished, and in no acute distress.   MSK: Left knee mild effusion normal motion with crepitation.  Nontender palpation.  Positive McMurray's test.  Intact strength. Stable ligamentous exam.  Right knee normal motion with crepitation.  Mildly tender palpation medial joint line. Negative Murray's test. Intact strength. Stable ligamentous exam.    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  No acute fractures are visible.  Left knee: Moderate patellofemoral DJD.  No acute fractures are visible.  Await formal radiology review    Assessment and Plan: 47 y.o. female with bilateral knee pain.  Left knee is worse than right and associated with significant mechanical symptoms locking and catching.  Plan for MRI of the left knee to further evaluate  for meniscus tears or loose body.  Plan for Voltaren gel.  Consider steroid injections or hyaluronic acid injections.  Work on Systems developer and weight loss.   PDMP not reviewed this encounter. Orders Placed This Encounter  Procedures   DG Knee AP/LAT W/Sunrise Left    Standing Status:   Future    Number of Occurrences:   1    Standing Expiration Date:   09/22/2023    Order Specific Question:   Reason for Exam (SYMPTOM  OR DIAGNOSIS REQUIRED)    Answer:   bilateral knee pain    Order Specific Question:   Preferred imaging location?    [facility]    Order Specific Question:   Is patient pregnant?    Answer:   No   DG Knee AP/LAT W/Sunrise Right    Standing Status:   Future    Number of Occurrences:   1    Standing Expiration Date:   09/22/2023    Order Specific Question:   Reason for Exam (SYMPTOM  OR DIAGNOSIS REQUIRED)    Answer:   bilateral knee pain    Order Specific Question:   Preferred imaging location?    Answer:   [facility]    Order Specific Question:   Is patient pregnant?    Answer:   No   MR Knee Left  Wo Contrast    Standing Status:   Future    Standing Expiration Date:   08/22/2024    Order Specific Question:   What  is the patient's sedation requirement?    Answer:   No Sedation    Order Specific Question:   Does the patient have a pacemaker or implanted devices?    Answer:   No    Order Specific Question:   Preferred imaging location?    Answer:   [facility] (table limit-550lbs)   No orders of the defined types were placed in this encounter.    Discussed warning signs or symptoms. Please see discharge instructions. Patient expresses understanding.   The above documentation has been reviewed and is accurate and complete Jade Sims, M.D.

## 2023-08-23 NOTE — Patient Instructions (Addendum)
Thank you for coming in today.   Please get an Xray today before you leave   Please use Voltaren gel (Generic Diclofenac Gel) up to 4x daily for pain as needed.  This is available over-the-counter as both the name brand Voltaren gel and the generic diclofenac gel.   Schedule the week prior to your vacation for a steroid injection

## 2023-09-06 NOTE — Progress Notes (Signed)
Right knee x-ray shows medium arthritis

## 2023-09-06 NOTE — Progress Notes (Signed)
Left knee x-ray shows medium arthritis.

## 2023-09-07 ENCOUNTER — Other Ambulatory Visit (HOSPITAL_COMMUNITY): Payer: Self-pay

## 2023-09-08 ENCOUNTER — Other Ambulatory Visit (HOSPITAL_COMMUNITY): Payer: Self-pay

## 2023-09-08 MED ORDER — MOUNJARO 7.5 MG/0.5ML ~~LOC~~ SOAJ
7.5000 mg | SUBCUTANEOUS | 11 refills | Status: DC
  Filled 2023-09-08: qty 2, 28d supply, fill #0

## 2023-09-11 ENCOUNTER — Encounter: Payer: Self-pay | Admitting: Internal Medicine

## 2023-09-16 ENCOUNTER — Ambulatory Visit
Admission: RE | Admit: 2023-09-16 | Discharge: 2023-09-16 | Disposition: A | Discharge status: Home / Self Care | Payer: BC Managed Care – PPO | Source: Ambulatory Visit | Attending: Internal Medicine | Admitting: Internal Medicine

## 2023-09-16 ENCOUNTER — Ambulatory Visit
Admission: RE | Admit: 2023-09-16 | Discharge: 2023-09-16 | Disposition: A | Discharge status: Home / Self Care | Payer: BC Managed Care – PPO | Source: Ambulatory Visit | Attending: Family Medicine | Admitting: Family Medicine

## 2023-09-16 DIAGNOSIS — M25562 Pain in left knee: Secondary | ICD-10-CM

## 2023-09-16 DIAGNOSIS — E348 Other specified endocrine disorders: Secondary | ICD-10-CM

## 2023-09-20 NOTE — Progress Notes (Signed)
Left knee MRI shows a tear of the meniscus and some severe arthritis in the knee.  Unfortunately this is going to be challenging to fix.  Recommend return to clinic to talk about treatment plan and options.  We may consider a cortisone shot and if that does not work with his other injections to do.  Ultimately you are going to need a knee replacement I fear.

## 2023-10-06 ENCOUNTER — Other Ambulatory Visit (HOSPITAL_COMMUNITY): Payer: Self-pay

## 2023-10-09 ENCOUNTER — Other Ambulatory Visit (HOSPITAL_COMMUNITY): Payer: Self-pay

## 2023-10-09 MED ORDER — MOUNJARO 10 MG/0.5ML ~~LOC~~ SOAJ
10.0000 mg | SUBCUTANEOUS | 11 refills | Status: DC
  Filled 2023-10-09 – 2023-12-03 (×2): qty 2, 28d supply, fill #0
  Filled 2024-01-02: qty 2, 28d supply, fill #1

## 2023-10-10 ENCOUNTER — Other Ambulatory Visit (HOSPITAL_BASED_OUTPATIENT_CLINIC_OR_DEPARTMENT_OTHER): Payer: Self-pay

## 2023-10-10 ENCOUNTER — Other Ambulatory Visit (HOSPITAL_COMMUNITY): Payer: Self-pay

## 2023-10-10 MED ORDER — MOUNJARO 5 MG/0.5ML ~~LOC~~ SOAJ
5.0000 mg | SUBCUTANEOUS | 11 refills | Status: DC
  Filled 2023-10-10: qty 2, 28d supply, fill #0

## 2023-10-18 ENCOUNTER — Telehealth: Payer: Self-pay | Admitting: Family Medicine

## 2023-10-18 ENCOUNTER — Ambulatory Visit: Payer: BC Managed Care – PPO | Admitting: Family Medicine

## 2023-10-18 NOTE — Telephone Encounter (Signed)
 Noted.

## 2023-10-18 NOTE — Telephone Encounter (Signed)
 Patient called stating that she saw her MRI results but she would like to wait on making a decision on what to do until she is able to loose more weight.  She said that she is down 50 pounds and still has more to get her her goal before she looks into surgery.  She just wanted to let Dr Alease Hunter know that she will be back in touch after she gets to her goal.

## 2023-11-10 ENCOUNTER — Other Ambulatory Visit (HOSPITAL_COMMUNITY): Payer: Self-pay

## 2023-11-10 MED ORDER — MOUNJARO 7.5 MG/0.5ML ~~LOC~~ SOAJ
7.5000 mg | SUBCUTANEOUS | 3 refills | Status: DC
  Filled 2023-11-10 (×2): qty 2, 28d supply, fill #0

## 2023-11-10 MED ORDER — TRAMADOL HCL 50 MG PO TABS
50.0000 mg | ORAL_TABLET | Freq: Three times a day (TID) | ORAL | 1 refills | Status: DC
  Filled 2023-11-10: qty 40, 14d supply, fill #0
  Filled 2024-01-02: qty 40, 14d supply, fill #1

## 2023-11-27 ENCOUNTER — Ambulatory Visit: Payer: Self-pay | Admitting: Family Medicine

## 2023-11-27 NOTE — Progress Notes (Deleted)
   Rubin Payor, PhD, LAT, ATC acting as a scribe for [name], MD.  Jade Sims is a 48 y.o. female who presents to [facility] at [facility] today for exacerbation of her LBP. Pt was last seen by Dr. Denyse Amass for her back on 12/30/21 and was prescribed prednisone, gabapentin, and tramadol. L-spine MRI was ordered, but never done   Today, pt reports ***  Dx imaging: 12/30/21 L-spine XR  Pertinent review of systems: ***  Relevant historical information: ***   Exam:  There were no vitals taken for this visit. General: Well Developed, well nourished, and in no acute distress.   MSK: ***    Lab and Radiology Results No results found for this or any previous visit (from the past 72 hours). No results found.     Assessment and Plan: 48 y.o. female with ***   PDMP not reviewed this encounter. No orders of the defined types were placed in this encounter.  No orders of the defined types were placed in this encounter.    Discussed warning signs or symptoms. Please see discharge instructions. Patient expresses understanding.   ***

## 2023-12-04 ENCOUNTER — Other Ambulatory Visit (HOSPITAL_COMMUNITY): Payer: Self-pay

## 2023-12-07 ENCOUNTER — Other Ambulatory Visit: Payer: Self-pay

## 2023-12-07 ENCOUNTER — Other Ambulatory Visit (HOSPITAL_COMMUNITY): Payer: Self-pay

## 2023-12-15 NOTE — Progress Notes (Deleted)
   Rubin Payor, PhD, LAT, ATC acting as a scribe for [name], MD.  Jade Sims is a 48 y.o. female who presents to [facility] at [facility] today for low back and hip pain. Pt was previously seen by Dr. Denyse Amass on 08/23/23 for bilat knee pain.  Today, pt c/o low back and hip pain x ***. Pt locates pain to ***  Radiating pain: LE numbness/tingling: LE weakness: Aggravates: Treatments tried:  Dx imaging: 12/30/21 L-spine XR  Pertinent review of systems: ***  Relevant historical information: ***   Exam:  There were no vitals taken for this visit. General: Well Developed, well nourished, and in no acute distress.   MSK: ***    Lab and Radiology Results No results found for this or any previous visit (from the past 72 hours). No results found.     Assessment and Plan: 48 y.o. female with ***   PDMP not reviewed this encounter. No orders of the defined types were placed in this encounter.  No orders of the defined types were placed in this encounter.    Discussed warning signs or symptoms. Please see discharge instructions. Patient expresses understanding.   ***

## 2023-12-18 ENCOUNTER — Ambulatory Visit: Payer: Self-pay | Admitting: Family Medicine

## 2023-12-20 ENCOUNTER — Encounter: Payer: Self-pay | Admitting: Family Medicine

## 2024-01-03 ENCOUNTER — Other Ambulatory Visit: Payer: Self-pay

## 2024-01-03 ENCOUNTER — Other Ambulatory Visit (HOSPITAL_COMMUNITY): Payer: Self-pay

## 2024-01-10 NOTE — Progress Notes (Unsigned)
   Jade Payor, PhD, LAT, ATC acting as a scribe for Jade Graham, MD.  Jade Sims is a 48 y.o. female who presents to [facility] at [facility] today for hip pain. Pt was previously seen by Dr. Denyse Amass on 08/23/23 for bilat knee OA.  Today, pt c/o R***L hip pain x ***. Pt locates pain to ***  Radiates: Aggravates: Treatments tried:  Pertinent review of systems: ***  Relevant historical information: ***   Exam:  There were no vitals taken for this visit. General: Well Developed, well nourished, and in no acute distress.   MSK: ***    Lab and Radiology Results No results found for this or any previous visit (from the past 72 hours). No results found.     Assessment and Plan: 48 y.o. female with ***   PDMP not reviewed this encounter. No orders of the defined types were placed in this encounter.  No orders of the defined types were placed in this encounter.    Discussed warning signs or symptoms. Please see discharge instructions. Patient expresses understanding.   ***

## 2024-01-12 ENCOUNTER — Other Ambulatory Visit (HOSPITAL_COMMUNITY): Payer: Self-pay

## 2024-01-12 ENCOUNTER — Ambulatory Visit: Payer: Self-pay | Admitting: Family Medicine

## 2024-01-12 ENCOUNTER — Encounter: Payer: Self-pay | Admitting: Family Medicine

## 2024-01-12 ENCOUNTER — Ambulatory Visit (INDEPENDENT_AMBULATORY_CARE_PROVIDER_SITE_OTHER)

## 2024-01-12 VITALS — BP 110/76 | HR 95 | Ht 66.0 in | Wt 313.0 lb

## 2024-01-12 DIAGNOSIS — M545 Low back pain, unspecified: Secondary | ICD-10-CM

## 2024-01-12 DIAGNOSIS — M25551 Pain in right hip: Secondary | ICD-10-CM | POA: Diagnosis not present

## 2024-01-12 DIAGNOSIS — M25552 Pain in left hip: Secondary | ICD-10-CM

## 2024-01-12 MED ORDER — GABAPENTIN 300 MG PO CAPS
300.0000 mg | ORAL_CAPSULE | Freq: Three times a day (TID) | ORAL | 3 refills | Status: AC | PRN
  Filled 2024-01-12: qty 90, 30d supply, fill #0

## 2024-01-12 MED ORDER — HYDROCODONE-ACETAMINOPHEN 5-325 MG PO TABS
1.0000 | ORAL_TABLET | Freq: Four times a day (QID) | ORAL | 0 refills | Status: AC | PRN
  Filled 2024-01-12: qty 15, 4d supply, fill #0

## 2024-01-12 MED ORDER — PREDNISONE 50 MG PO TABS
50.0000 mg | ORAL_TABLET | Freq: Every day | ORAL | 0 refills | Status: DC
Start: 2024-01-12 — End: 2024-02-12
  Filled 2024-01-12: qty 5, 5d supply, fill #0

## 2024-01-12 NOTE — Patient Instructions (Addendum)
 Thank you for coming in today.   Let us know if you develop weakness  You received an injection today. Seek immediate medical attention if the joint becomes red, extremely painful, or is oozing fluid.   Please get an Xray today before you leave   A referral for physical therapy has been submitted. A representative from the physical therapy office will contact you to coordinate scheduling after confirming your benefits with your insurance provider. If you do not hear from the physical therapy office within the next 1-2 weeks, please let us know.   Take Gabapentin, Prednisone, and Hydrocodone-APAP as directed

## 2024-01-13 ENCOUNTER — Other Ambulatory Visit (HOSPITAL_COMMUNITY): Payer: Self-pay

## 2024-01-22 NOTE — Progress Notes (Signed)
 Pelvis x-ray shows some arthritis of both hip joints.

## 2024-01-22 NOTE — Progress Notes (Signed)
 Low back x-ray shows a mild curvature of the spine.

## 2024-01-24 ENCOUNTER — Ambulatory Visit: Admitting: Physical Therapy

## 2024-01-26 ENCOUNTER — Other Ambulatory Visit: Payer: Self-pay

## 2024-01-26 ENCOUNTER — Encounter: Payer: Self-pay | Admitting: Family Medicine

## 2024-01-26 ENCOUNTER — Other Ambulatory Visit (HOSPITAL_COMMUNITY): Payer: Self-pay

## 2024-01-26 DIAGNOSIS — R252 Cramp and spasm: Secondary | ICD-10-CM

## 2024-01-26 MED ORDER — TIZANIDINE HCL 4 MG PO TABS
4.0000 mg | ORAL_TABLET | Freq: Four times a day (QID) | ORAL | 1 refills | Status: AC | PRN
  Filled 2024-01-26: qty 30, 8d supply, fill #0
  Filled 2024-09-12: qty 30, 8d supply, fill #1

## 2024-01-29 ENCOUNTER — Other Ambulatory Visit (HOSPITAL_COMMUNITY): Payer: Self-pay

## 2024-01-29 ENCOUNTER — Encounter (HOSPITAL_COMMUNITY): Payer: Self-pay

## 2024-01-30 ENCOUNTER — Other Ambulatory Visit (HOSPITAL_COMMUNITY): Payer: Self-pay

## 2024-01-30 MED ORDER — MOUNJARO 12.5 MG/0.5ML ~~LOC~~ SOAJ
12.5000 mg | SUBCUTANEOUS | 1 refills | Status: DC
Start: 2024-01-30 — End: 2024-03-28
  Filled 2024-01-30: qty 2, 28d supply, fill #0
  Filled 2024-02-25: qty 2, 28d supply, fill #1

## 2024-02-02 ENCOUNTER — Other Ambulatory Visit (HOSPITAL_COMMUNITY): Payer: Self-pay

## 2024-02-12 ENCOUNTER — Ambulatory Visit: Admitting: Family Medicine

## 2024-02-12 ENCOUNTER — Encounter: Payer: Self-pay | Admitting: Family Medicine

## 2024-02-12 VITALS — BP 126/86 | HR 83 | Ht 66.0 in | Wt 318.0 lb

## 2024-02-12 DIAGNOSIS — M25552 Pain in left hip: Secondary | ICD-10-CM

## 2024-02-12 DIAGNOSIS — M25551 Pain in right hip: Secondary | ICD-10-CM

## 2024-02-12 NOTE — Progress Notes (Signed)
   I, Miquel Amen, CMA acting as a scribe for Garlan Juniper, MD.  Jade Sims is a 48 y.o. female who presents to [facility] at [facility] today for f/u R hip and low back pain. Pt was last seen by Dr. Alease Hunter on 01/12/24 and was given a R GT steroid injection and was prescribed gabapentin  and prednisone .  Today, pt reports significant improvement of hip and lower back sx s/p last visit. Sx responded well to injection. Took Prednisone  then d/c after XR results came back. Taking Gabapentin  PRN. Denies n/t/w in the R LE.   Dx imaging: 01/12/24 L-spine & pelvis XR  Pertinent review of systems: No fevers or chills  Relevant historical information: Hypertension and diabetes.   Exam:  BP 126/86   Pulse 83   Ht 5\' 6"  (1.676 m)   Wt (!) 318 lb (144.2 kg)   SpO2 98%   BMI 51.33 kg/m  General: Well Developed, well nourished, and in no acute distress.   MSK: L-spine normal motion lower extremity strength is intact normal gait.     Assessment and Plan: 48 y.o. female with chronic back and hip pain.  Improving with home exercises and greater trochanter injection Check back as needed.  PDMP not reviewed this encounter. No orders of the defined types were placed in this encounter.  No orders of the defined types were placed in this encounter.    Discussed warning signs or symptoms. Please see discharge instructions. Patient expresses understanding.   The above documentation has been reviewed and is accurate and complete Garlan Juniper, M.D.

## 2024-02-12 NOTE — Patient Instructions (Signed)
 Thank you for coming in today.

## 2024-02-20 ENCOUNTER — Ambulatory Visit: Admitting: Physical Therapy

## 2024-03-01 ENCOUNTER — Other Ambulatory Visit (HOSPITAL_COMMUNITY): Payer: Self-pay

## 2024-03-19 ENCOUNTER — Other Ambulatory Visit (HOSPITAL_COMMUNITY): Payer: Self-pay

## 2024-03-19 MED ORDER — MOUNJARO 15 MG/0.5ML ~~LOC~~ SOAJ
15.0000 mg | SUBCUTANEOUS | 3 refills | Status: AC
  Filled 2024-03-19 – 2024-03-21 (×3): qty 2, 28d supply, fill #0
  Filled 2024-04-17: qty 2, 28d supply, fill #1
  Filled 2024-05-15 (×2): qty 2, 28d supply, fill #2

## 2024-03-20 ENCOUNTER — Other Ambulatory Visit (HOSPITAL_COMMUNITY): Payer: Self-pay

## 2024-03-21 ENCOUNTER — Ambulatory Visit: Admitting: Family Medicine

## 2024-03-21 ENCOUNTER — Other Ambulatory Visit: Payer: Self-pay

## 2024-03-21 ENCOUNTER — Other Ambulatory Visit (HOSPITAL_COMMUNITY): Payer: Self-pay

## 2024-03-21 NOTE — Progress Notes (Deleted)
   Joanna Muck, PhD, LAT, ATC acting as a scribe for Garlan Juniper, MD.  Jade Sims is a 48 y.o. female who presents to [facility] at [facility] today for exacerbation of her R hip pain. Pt was last seen by Dr. Alease Hunter on 02/12/24 and last R GT steroid injection on 01/12/24.  Today, pt reports ***  Dx imaging: 01/12/24 L-spine & pelvis XR  Pertinent review of systems: ***  Relevant historical information: ***   Exam:  There were no vitals taken for this visit. General: Well Developed, well nourished, and in no acute distress.   MSK: ***    Lab and Radiology Results No results found for this or any previous visit (from the past 72 hours). No results found.     Assessment and Plan: 48 y.o. female with ***   PDMP not reviewed this encounter. No orders of the defined types were placed in this encounter.  No orders of the defined types were placed in this encounter.    Discussed warning signs or symptoms. Please see discharge instructions. Patient expresses understanding.   ***

## 2024-03-28 ENCOUNTER — Ambulatory Visit: Admitting: Family Medicine

## 2024-03-28 ENCOUNTER — Other Ambulatory Visit: Payer: Self-pay

## 2024-03-28 VITALS — BP 122/84 | HR 72 | Ht 66.0 in | Wt 317.0 lb

## 2024-03-28 DIAGNOSIS — M25551 Pain in right hip: Secondary | ICD-10-CM

## 2024-03-28 DIAGNOSIS — G8929 Other chronic pain: Secondary | ICD-10-CM

## 2024-03-28 NOTE — Patient Instructions (Addendum)
 Thank you for coming in today.   You received an injection today. Seek immediate medical attention if the joint becomes red, extremely painful, or is oozing fluid.   *Reminder, Dr. Joane will be out of the office starting August 1st for about 6 weeks

## 2024-03-28 NOTE — Progress Notes (Signed)
   LILLETTE Ileana Collet, PhD, LAT, ATC acting as a scribe for Artist Lloyd, MD.  Jade Sims is a 48 y.o. female who presents to [facility] at [facility] today for exacerbation of her R hip pain. Pt was last seen by Dr. Lloyd on 02/12/24 and last R GT steroid injection on 01/12/24.  Today, pt reports R hip pain returned about 2-wks ago. Pt loves to garden and has been doing a lot of bending, lifting, etc. Pt locates pain to the lateral aspect of her R hip. She notes she took a Tramadol  prior to today's visit. Difficulty sleeping.  Dx imaging: 01/12/24 L-spine & pelvis XR  Pertinent review of systems: No fevers or chills  Relevant historical information: Hypertension Patient has been doing a lot of gardening.  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 Diabetes.  Exam:  BP 122/84   Pulse 72   Ht 5' 6 (1.676 m)   Wt (!) 317 lb (143.8 kg)   SpO2 98%   BMI 51.17 kg/m  General: Well Developed, well nourished, and in no acute distress.   MSK: Right hip: Normal-appearing Tender palpation greater trochanter.  Hip abduction strength is diminished.    Lab and Radiology Results  Procedure: Real-time Ultrasound Guided Injection of right hip greater trochanter bursa Device: Philips Affiniti 50G/GE Logiq Images permanently stored and available for review in PACS Verbal informed consent obtained.  Discussed risks and benefits of procedure. Warned about infection, bleeding, hyperglycemia damage to structures among others. Patient expresses understanding and agreement Time-out conducted.   Noted no overlying erythema, induration, or other signs of local infection.   Skin prepped in a sterile fashion.   Local anesthesia: Topical Ethyl chloride.   With sterile technique and under real time ultrasound guidance: 40 mg of Kenalog and 2 mL of Marcaine injected into greater trochanter bursa. Fluid seen entering the bursa.   Completed without  difficulty   Pain immediately resolved suggesting accurate placement of the medication.   Advised to call if fevers/chills, erythema, induration, drainage, or persistent bleeding.   Images permanently stored and available for review in the ultrasound unit.  Impression: Technically successful ultrasound guided injection.         Assessment and Plan: 48 y.o. female with right lateral hip pain due to trochanteric bursitis or hip abductor tendinopathy.  Plan for repeat injection today but will refer to physical therapy.  She does have some arthritis on x-ray but the location of pain is not consistent with hip arthritis.  Physical therapy should be helpful.   PDMP not reviewed this encounter. Orders Placed This Encounter  Procedures   US  LIMITED JOINT SPACE STRUCTURES LOW RIGHT(NO LINKED CHARGES)    Reason for Exam (SYMPTOM  OR DIAGNOSIS REQUIRED):   right hip pain    Preferred imaging location?:   [facility]   No orders of the defined types were placed in this encounter.    Discussed warning signs or symptoms. Please see discharge instructions. Patient expresses understanding.   The above documentation has been reviewed and is accurate and complete Artist Lloyd, M.D.

## 2024-04-12 ENCOUNTER — Other Ambulatory Visit: Payer: Self-pay

## 2024-05-15 ENCOUNTER — Other Ambulatory Visit: Payer: Self-pay

## 2024-05-15 ENCOUNTER — Other Ambulatory Visit (HOSPITAL_COMMUNITY): Payer: Self-pay

## 2024-05-15 MED ORDER — TRAMADOL HCL 50 MG PO TABS
50.0000 mg | ORAL_TABLET | Freq: Three times a day (TID) | ORAL | 0 refills | Status: AC
  Filled 2024-05-15 (×2): qty 42, 14d supply, fill #0

## 2024-05-22 ENCOUNTER — Other Ambulatory Visit (HOSPITAL_COMMUNITY): Payer: Self-pay

## 2024-05-22 MED ORDER — MOUNJARO 5 MG/0.5ML ~~LOC~~ SOAJ
5.0000 mg | SUBCUTANEOUS | 5 refills | Status: AC
  Filled 2024-05-22 (×2): qty 2, 28d supply, fill #0
  Filled 2024-06-18: qty 2, 28d supply, fill #1
  Filled 2024-07-16: qty 2, 28d supply, fill #2
  Filled 2024-08-06: qty 2, 28d supply, fill #3

## 2024-05-22 MED ORDER — BUPROPION HCL ER (XL) 150 MG PO TB24
150.0000 mg | ORAL_TABLET | Freq: Every morning | ORAL | 0 refills | Status: AC
  Filled 2024-05-22: qty 7, 7d supply, fill #0

## 2024-05-22 MED ORDER — BUPROPION HCL ER (XL) 300 MG PO TB24
300.0000 mg | ORAL_TABLET | Freq: Every morning | ORAL | 5 refills | Status: AC
  Filled 2024-05-22: qty 30, 30d supply, fill #0
  Filled 2024-06-18: qty 30, 30d supply, fill #1
  Filled 2024-08-06: qty 30, 30d supply, fill #2
  Filled 2024-09-12: qty 30, 30d supply, fill #3
  Filled 2024-10-07: qty 30, 30d supply, fill #4

## 2024-05-23 ENCOUNTER — Other Ambulatory Visit (HOSPITAL_COMMUNITY): Payer: Self-pay

## 2024-05-23 MED ORDER — PANTOPRAZOLE SODIUM 40 MG PO TBEC
40.0000 mg | DELAYED_RELEASE_TABLET | Freq: Two times a day (BID) | ORAL | 1 refills | Status: AC
  Filled 2024-05-23: qty 180, 90d supply, fill #0

## 2024-05-23 MED ORDER — VITAMIN D3 50 MCG (2000 UT) PO TABS
50.0000 ug | ORAL_TABLET | Freq: Every day | ORAL | 3 refills | Status: AC
  Filled 2024-05-23: qty 90, 90d supply, fill #0
  Filled 2024-09-12: qty 90, 90d supply, fill #1
  Filled 2024-10-07: qty 90, 90d supply, fill #2

## 2024-05-24 ENCOUNTER — Other Ambulatory Visit (HOSPITAL_COMMUNITY): Payer: Self-pay

## 2024-05-27 ENCOUNTER — Other Ambulatory Visit (HOSPITAL_COMMUNITY): Payer: Self-pay

## 2024-05-27 MED ORDER — PANTOPRAZOLE SODIUM 40 MG PO TBEC: 40.0000 mg | DELAYED_RELEASE_TABLET | Freq: Two times a day (BID) | ORAL | 3 refills | Status: AC

## 2024-06-05 ENCOUNTER — Other Ambulatory Visit (HOSPITAL_COMMUNITY): Payer: Self-pay

## 2024-06-05 MED ORDER — LOSARTAN POTASSIUM-HCTZ 50-12.5 MG PO TABS
1.0000 | ORAL_TABLET | Freq: Every day | ORAL | 3 refills | Status: AC
  Filled 2024-06-05: qty 30, 30d supply, fill #0
  Filled 2024-07-05 – 2024-07-18 (×2): qty 30, 30d supply, fill #1
  Filled 2024-08-06 – 2024-08-13 (×2): qty 30, 30d supply, fill #2
  Filled 2024-10-07: qty 30, 30d supply, fill #3

## 2024-07-18 ENCOUNTER — Other Ambulatory Visit (HOSPITAL_COMMUNITY): Payer: Self-pay

## 2024-08-06 ENCOUNTER — Other Ambulatory Visit (HOSPITAL_COMMUNITY): Payer: Self-pay

## 2024-08-13 ENCOUNTER — Other Ambulatory Visit (HOSPITAL_COMMUNITY): Payer: Self-pay

## 2024-08-13 ENCOUNTER — Other Ambulatory Visit: Payer: Self-pay

## 2024-08-14 ENCOUNTER — Other Ambulatory Visit (HOSPITAL_COMMUNITY): Payer: Self-pay

## 2024-08-14 ENCOUNTER — Other Ambulatory Visit: Payer: Self-pay

## 2024-08-23 ENCOUNTER — Other Ambulatory Visit (HOSPITAL_COMMUNITY): Payer: Self-pay

## 2024-08-23 MED ORDER — METRONIDAZOLE 500 MG PO TABS
500.0000 mg | ORAL_TABLET | Freq: Two times a day (BID) | ORAL | 0 refills | Status: AC
  Filled 2024-08-23: qty 14, 7d supply, fill #0

## 2024-09-12 ENCOUNTER — Other Ambulatory Visit (HOSPITAL_COMMUNITY): Payer: Self-pay

## 2024-09-12 MED ORDER — MOUNJARO 7.5 MG/0.5ML ~~LOC~~ SOAJ
7.5000 mg | SUBCUTANEOUS | 3 refills | Status: AC
  Filled 2024-09-12: qty 2, 28d supply, fill #0

## 2024-09-12 MED ORDER — MOUNJARO 10 MG/0.5ML ~~LOC~~ SOAJ
10.0000 mg | SUBCUTANEOUS | 3 refills | Status: AC
  Filled 2024-09-12 – 2024-10-07 (×3): qty 2, 28d supply, fill #0

## 2024-09-13 ENCOUNTER — Other Ambulatory Visit: Payer: Self-pay

## 2024-09-13 ENCOUNTER — Other Ambulatory Visit (HOSPITAL_COMMUNITY): Payer: Self-pay

## 2024-09-17 ENCOUNTER — Other Ambulatory Visit (HOSPITAL_COMMUNITY): Payer: Self-pay

## 2024-10-07 ENCOUNTER — Other Ambulatory Visit (HOSPITAL_COMMUNITY): Payer: Self-pay

## 2024-10-07 ENCOUNTER — Other Ambulatory Visit: Payer: Self-pay

## 2024-10-08 ENCOUNTER — Encounter: Payer: Self-pay | Admitting: Pharmacist

## 2024-10-08 ENCOUNTER — Other Ambulatory Visit: Payer: Self-pay

## 2024-10-08 ENCOUNTER — Other Ambulatory Visit (HOSPITAL_COMMUNITY): Payer: Self-pay

## 2024-10-08 MED ORDER — METFORMIN HCL ER 500 MG PO TB24
ORAL_TABLET | ORAL | 0 refills | Status: AC
  Filled 2024-10-08 – 2024-10-12 (×2): qty 49, 28d supply, fill #0

## 2024-10-09 ENCOUNTER — Other Ambulatory Visit (HOSPITAL_COMMUNITY): Payer: Self-pay

## 2024-10-09 ENCOUNTER — Other Ambulatory Visit: Payer: Self-pay

## 2024-10-10 ENCOUNTER — Other Ambulatory Visit: Payer: Self-pay

## 2024-10-10 ENCOUNTER — Other Ambulatory Visit (HOSPITAL_COMMUNITY): Payer: Self-pay

## 2024-10-10 ENCOUNTER — Encounter: Payer: Self-pay | Admitting: Pharmacist

## 2024-10-12 ENCOUNTER — Other Ambulatory Visit (HOSPITAL_COMMUNITY): Payer: Self-pay

## 2024-10-14 ENCOUNTER — Other Ambulatory Visit: Payer: Self-pay

## 2024-10-17 ENCOUNTER — Other Ambulatory Visit (HOSPITAL_COMMUNITY): Payer: Self-pay

## 2024-10-17 MED ORDER — MEDROXYPROGESTERONE ACETATE 10 MG PO TABS
20.0000 mg | ORAL_TABLET | Freq: Every day | ORAL | 0 refills | Status: AC
  Filled 2024-10-17: qty 20, 10d supply, fill #0

## 2024-10-28 ENCOUNTER — Other Ambulatory Visit (HOSPITAL_COMMUNITY): Payer: Self-pay

## 2024-11-06 ENCOUNTER — Other Ambulatory Visit (HOSPITAL_COMMUNITY): Payer: Self-pay

## 2024-11-06 MED ORDER — MOUNJARO 10 MG/0.5ML ~~LOC~~ SOAJ
10.0000 mg | SUBCUTANEOUS | 3 refills | Status: AC
  Filled 2024-11-06: qty 2, 28d supply, fill #0
